# Patient Record
Sex: Male | Born: 1975 | Race: White | Hispanic: No | Marital: Single | Smoking: Current every day smoker
Health system: Southern US, Community
[De-identification: ages and names within clinical notes are randomized; demographics above are authoritative.]

## PROBLEM LIST (undated history)

## (undated) DIAGNOSIS — S0530XA Ocular laceration without prolapse or loss of intraocular tissue, unspecified eye, initial encounter: Secondary | ICD-10-CM

## (undated) HISTORY — PX: EYE SURGERY: SHX253

## (undated) HISTORY — DX: Ocular laceration without prolapse or loss of intraocular tissue, unspecified eye, initial encounter: S05.30XA

## (undated) HISTORY — PX: KNEE SURGERY: SHX244

---

## 2010-11-27 ENCOUNTER — Emergency Department
Admission: EM | Admit: 2010-11-27 | Disposition: A | Discharge status: Home / Self Care | Payer: Self-pay | Source: Ambulatory Visit

## 2012-04-03 ENCOUNTER — Emergency Department
Admission: EM | Admit: 2012-04-03 | Disposition: A | Discharge status: Home / Self Care | Payer: Self-pay | Source: Ambulatory Visit

## 2012-04-03 LAB — BASIC METABOLIC PANEL
Anion Gap: 15.4 mMol/L (ref 7.0–18.0)
BUN / Creatinine Ratio: 18.8 Ratio (ref 10.0–30.0)
BUN: 13 mg/dL (ref 7–22)
CO2: 15 mMol/L — CL (ref 20.0–30.0)
Calcium: 6.9 mg/dL — ABNORMAL LOW (ref 8.5–10.5)
Chloride: 117 mMol/L — ABNORMAL HIGH (ref 98–110)
Creatinine: 0.69 mg/dL (ref 0.60–1.30)
EGFR: >60 mL/min/{1.73_m2}
Glucose: 85 mg/dL (ref 70–99)
Osmolality Calc: 286 mOsm/kg (ref 275–300)
Potassium: 3.4 mMol/L — ABNORMAL LOW (ref 3.5–5.3)
Sodium: 144 mMol/L (ref 136–147)

## 2012-04-03 LAB — CBC AND DIFFERENTIAL
Basophils %: 0.1 % (ref 0.0–3.0)
Basophils Absolute: 0 10*3/uL (ref 0.0–0.3)
Eosinophils %: 1.3 % (ref 0.0–7.0)
Eosinophils Absolute: 0.2 10*3/uL (ref 0.0–0.8)
Hematocrit: 51.5 % (ref 39.0–52.5)
Hemoglobin: 17.3 gm/dL (ref 13.0–17.5)
Lymphocytes Absolute: 0.6 10*3/uL (ref 0.6–5.1)
Lymphocytes: 3.6 % — ABNORMAL LOW (ref 15.0–46.0)
MCH: 30 pg (ref 28–35)
MCHC: 34 gm/dL (ref 33–37)
MCV: 90 fL (ref 80–100)
MPV: 9.1 fL (ref 8.0–12.0)
Monocytes Absolute: 0.6 10*3/uL (ref 0.1–1.7)
Monocytes: 3.7 % (ref 3.0–15.0)
Neutrophils %: 91.3 % — ABNORMAL HIGH (ref 42.0–78.0)
Neutrophils Absolute: 14 10*3/uL — ABNORMAL HIGH (ref 1.7–8.6)
PLT CT: 231 10*3/uL (ref 130–440)
RBC: 5.73 10*6/uL — ABNORMAL HIGH (ref 4.00–5.70)
RDW: 13.2 % (ref 12.0–15.0)
WBC: 15.3 10*3/uL — ABNORMAL HIGH (ref 4.0–11.0)

## 2013-01-06 ENCOUNTER — Ambulatory Visit (INDEPENDENT_AMBULATORY_CARE_PROVIDER_SITE_OTHER): Payer: 59 | Admitting: Physician Assistant

## 2013-01-06 ENCOUNTER — Emergency Department
Admission: EM | Admit: 2013-01-06 | Discharge: 2013-01-06 | Disposition: A | Discharge status: Home / Self Care | Payer: 59 | Attending: Emergency Medicine | Admitting: Emergency Medicine

## 2013-01-06 ENCOUNTER — Emergency Department: Payer: 59

## 2013-01-06 ENCOUNTER — Encounter (INDEPENDENT_AMBULATORY_CARE_PROVIDER_SITE_OTHER): Payer: Self-pay

## 2013-01-06 VITALS — BP 139/78 | HR 95 | Temp 97.5°F | Resp 18 | Ht 73.0 in | Wt 224.6 lb

## 2013-01-06 DIAGNOSIS — H6693 Otitis media, unspecified, bilateral: Secondary | ICD-10-CM

## 2013-01-06 DIAGNOSIS — IMO0001 Reserved for inherently not codable concepts without codable children: Secondary | ICD-10-CM | POA: Insufficient documentation

## 2013-01-06 DIAGNOSIS — J329 Chronic sinusitis, unspecified: Secondary | ICD-10-CM | POA: Insufficient documentation

## 2013-01-06 DIAGNOSIS — H6690 Otitis media, unspecified, unspecified ear: Secondary | ICD-10-CM | POA: Insufficient documentation

## 2013-01-06 DIAGNOSIS — R51 Headache: Secondary | ICD-10-CM | POA: Insufficient documentation

## 2013-01-06 DIAGNOSIS — J069 Acute upper respiratory infection, unspecified: Secondary | ICD-10-CM

## 2013-01-06 DIAGNOSIS — R519 Headache, unspecified: Secondary | ICD-10-CM

## 2013-01-06 LAB — POCT INFLUENZA A/B
POCT Rapid Influenza A AG: NEGATIVE
POCT Rapid Influenza B AG: NEGATIVE

## 2013-01-06 MED ORDER — AMOXICILLIN-POT CLAVULANATE 875-125 MG PO TABS
1.00 | ORAL_TABLET | Freq: Two times a day (BID) | ORAL | Status: AC
Start: 2013-01-06 — End: 2013-01-16

## 2013-01-06 MED ORDER — KETOROLAC TROMETHAMINE 30 MG/ML IJ SOLN
30.00 mg | Freq: Once | INTRAMUSCULAR | Status: AC
Start: 2013-01-06 — End: 2013-01-06
  Administered 2013-01-06: 30 mg via INTRAVENOUS

## 2013-01-06 MED ORDER — DIPHENHYDRAMINE HCL 50 MG/ML IJ SOLN
INTRAMUSCULAR | Status: AC
Start: 2013-01-06 — End: ?
  Filled 2013-01-06: qty 1

## 2013-01-06 MED ORDER — METOCLOPRAMIDE HCL 5 MG/ML IJ SOLN
INTRAMUSCULAR | Status: AC
Start: 2013-01-06 — End: ?
  Filled 2013-01-06: qty 2

## 2013-01-06 MED ORDER — KETOROLAC TROMETHAMINE 30 MG/ML IJ SOLN
INTRAMUSCULAR | Status: AC
Start: 2013-01-06 — End: ?
  Filled 2013-01-06: qty 1

## 2013-01-06 MED ORDER — DIPHENHYDRAMINE HCL 50 MG/ML IJ SOLN
25.0000 mg | Freq: Once | INTRAMUSCULAR | Status: AC
Start: 2013-01-06 — End: 2013-01-06
  Administered 2013-01-06: 25 mg via INTRAVENOUS

## 2013-01-06 MED ORDER — PSEUDOEPHEDRINE HCL 60 MG PO TABS
60.0000 mg | ORAL_TABLET | ORAL | Status: DC | PRN
Start: 2013-01-06 — End: 2015-10-27

## 2013-01-06 MED ORDER — PREDNISONE 20 MG PO TABS
ORAL_TABLET | ORAL | Status: AC
Start: 2013-01-06 — End: ?
  Filled 2013-01-06: qty 3

## 2013-01-06 MED ORDER — FLUTICASONE PROPIONATE 50 MCG/ACT NA SUSP
1.0000 | Freq: Every day | NASAL | Status: DC
Start: 2013-01-06 — End: 2015-10-27

## 2013-01-06 MED ORDER — METOCLOPRAMIDE HCL 5 MG/ML IJ SOLN
10.0000 mg | Freq: Once | INTRAMUSCULAR | Status: AC
Start: 2013-01-06 — End: 2013-01-06
  Administered 2013-01-06: 10 mg via INTRAVENOUS

## 2013-01-06 MED ORDER — PREDNISONE 20 MG PO TABS
60.0000 mg | ORAL_TABLET | Freq: Once | ORAL | Status: AC
Start: 2013-01-06 — End: 2013-01-06
  Administered 2013-01-06: 60 mg via ORAL

## 2013-01-06 NOTE — Progress Notes (Signed)
Subjective:    Patient ID: Brandon Murillo is a 38 y.o. male.    HPI Comments: Patient tells me he has been having constant headache 10/10 the past 3 days.  No fevers but some chills and body aches, sore throat and cough with some sputum production yellowish in color.  Denies having headache similar in past and admits to eye pain.  No vomiting or diarrhea, no numbness or tingling in extremities, no neck stiffness.    Headache   This is a new problem. The current episode started in the past 7 days. The problem occurs constantly. The problem has been gradually worsening. The pain is located in the bilateral region. The pain does not radiate. The pain quality is not similar to prior headaches. The quality of the pain is described as aching. The pain is at a severity of 10/10. Associated symptoms include coughing, sinus pressure and a sore throat. Pertinent negatives include no back pain, blurred vision, dizziness, ear pain, fever, hearing loss, numbness, scalp tenderness, seizures or weakness.   URI   Associated symptoms include congestion, coughing, headaches and a sore throat. Pertinent negatives include no ear pain or wheezing.       The following portions of the patient's history were reviewed and updated as appropriate: allergies, current medications, past family history, past medical history, past social history, past surgical history and problem list.    Review of Systems   Constitutional: Negative.  Negative for fever.   HENT: Positive for congestion, postnasal drip, sinus pressure and sore throat. Negative for ear discharge, ear pain, facial swelling and hearing loss.    Eyes: Negative.  Negative for blurred vision.   Respiratory: Positive for cough. Negative for apnea, choking, chest tightness, shortness of breath, wheezing and stridor.    Cardiovascular: Negative.    Gastrointestinal: Negative.    Musculoskeletal: Negative.  Negative for back pain.   Neurological: Positive for headaches. Negative for  dizziness, tremors, seizures, syncope, facial asymmetry, speech difficulty, weakness, light-headedness and numbness.         Objective:    BP 139/78  Pulse 95  Temp 97.5 F (36.4 C) (Oral)  Resp 18  Ht 1.854 m (6\' 1" )  Wt 101.878 kg (224 lb 9.6 oz)  BMI 29.64 kg/m2    Physical Exam   Nursing note and vitals reviewed.  Constitutional: He is oriented to person, place, and time. He appears well-developed and well-nourished.   HENT:   Right Ear: Tympanic membrane mobility is abnormal. A middle ear effusion is present.   Left Ear: Tympanic membrane mobility is abnormal. A middle ear effusion is present.   Ears:    Nose: Mucosal edema present.   Mouth/Throat: Uvula is midline and mucous membranes are normal. Posterior oropharyngeal erythema present. No oropharyngeal exudate, posterior oropharyngeal edema or tonsillar abscesses.   Eyes: Conjunctivae normal and EOM are normal. Pupils are equal, round, and reactive to light. Right eye exhibits no discharge. Left eye exhibits no discharge.   Neck: Neck supple.   Cardiovascular: Normal rate, regular rhythm and normal heart sounds.    Pulmonary/Chest: Effort normal and breath sounds normal.   Lymphadenopathy:     He has no cervical adenopathy.   Neurological: He is alert and oriented to person, place, and time. He has normal strength. He displays normal reflexes. No cranial nerve deficit or sensory deficit. Coordination normal.   Reflex Scores:       Patellar reflexes are 2+ on the right side and 2+ on  the left side.  Psychiatric: He has a normal mood and affect. His behavior is normal.         Assessment and Plan:       Lab Results from today's visit:  Recent Results (from the past 4 hour(s))   POCT INFLUENZA A/B    Collection Time    01/06/13 10:10 AM       Component Value Range    POCT QC Pass      POCT Rapid Influenza A AG Negative  Negative    POCT Rapid Influenza B AG Negative  Negative       Radiology Results from today's visit:  No results found.    Assessment/Plan  for today's visit:  1. Upper respiratory infection  POCT Influenza A/B       Nathen May [place], [place]  [facility]  01/06/2013 10:21 AM        Patient sent to ER for worst headache of his life, rule out headache related to inner ear infections/ staph infection.              Leanord Asal, [place]  [facility]  01/06/2013  10:21 AM

## 2013-01-06 NOTE — Discharge Instructions (Signed)
Causes of Sinusitis           Mucus helps keep your sinuses clean. But mucus may build up in the sinuses due to colds, allergies, or obstructions. These things interfere with the natural drainage of mucus. This may lead to sinusitis (sinus inflammation and infection). Acute sinusitis comes on suddenly. It often happens right after an upper respiratory infection, such as a cold. Chronic sinusitis is ongoing swelling of the sinus lining. This is often the result of allergies or chronic infections.  Colds and Other Infections  A cold or flu may cause your sinus and nasal linings to swell. Sinus openings can become blocked. This causes mucus to back up. This backed-up mucus becomes a perfect place for bacteria to grow. Thick, yellow, or discolored mucus is one sign of infection.  Allergic Reactions  You may be sensitive to certain substances. This causes the release of histamine in the body. Histamine makes your sinus and nasal linings swell. Long-term swelling clogs your sinuses. It prevents the cilia (tiny hairs in the nasal lining) from sweeping away mucus. Allergy symptoms can be persistent. But they're less severe than with colds.  Obstructions   A polyp is a sac of swollen tissue. It can be the result of an allergy or infection. It may block the middle meatus (the opening where most of your sinuses drain). It may even grow large enough to block your nose.   A deviated septum is when the thin wall inside your nose is pushed to one side. It is often the result of injury. This can block your middle meatus.   [address], [place], [place] [address]. All rights reserved. This information is not intended as a substitute for professional medical care. Always follow your healthcare professional's instructions.      Middle Ear Infection (Adult)  You have an infection of the middle ear (the space behind the eardrum). It can occur as a result of the common cold. This is because congestion  can block the internal passage (eustachian tube) that drains fluid from the middle ear. When the middle ear fills with fluid, bacteria can grow there and cause an infection. Oral antibiotics are used to treat this illness, not ear drops. Symptoms usually start to improve within 1-2 days of treatment.    Home Care:   Doreatha Martin all of the antibiotic medicine prescribed, even though you may feel better after the first few days.   You may use acetaminophen (Tylenol) or ibuprofen (Motrin, Advil) to control pain, unless something else was prescribed. [NOTE: If you have chronic liver or kidney disease or have ever had a stomach ulcer or GI bleeding, talk with your doctor before using these medicines.] (Do not give aspirin to anyone under 76 years of age who is ill with a fever. It may cause severe liver damage.)  Follow Up  with your doctor or this facility in two weeks if all symptoms have not cleared, or if hearing does not return to normal within one month.  Get Prompt Medical Attention  if any of the following occur:   Ear pain gets worse or does not improve after three days of treatment   Unusual drowsiness or confusion   Neck pain, stiff neck or headache   Fluid or blood draining from the ear canal   Fever of 100.106F (38C) or higher after 3 days of antibiotics, or as directed by your healthcare provider   Convulsion (seizure)   [id number] [employer],  [address], [place] [address]. All rights reserved. This information is not intended as a substitute for professional medical care. Always follow your healthcare professional's instructions.

## 2013-01-06 NOTE — ED Notes (Signed)
Patient stated he has had headaches since Saturday, feeling run down and when patient coughs his chest hurt.  Patient went to Urgent Care today and was checked for the flu which was negative.  Staff at Urgent Care advised patient to come to the emergency room.

## 2013-01-06 NOTE — ED Provider Notes (Signed)
Physician/Midlevel provider first contact with patient: 01/06/13 1424         [facility] EMERGENCY DEPARTMENT HISTORY AND PHYSICAL EXAM      Patient Name: Brandon Murillo, Brandon Murillo  Encounter Date:  01/06/2013  ED Provider: Simone Curia PA-C  PCP: Christa See, MD  Patient DOB:  18-Apr-1975  MRN:  [id number]  Room:  12/ED12-A      History of Presenting Illness:   Chief complaint: Migraine    HPI/ROS is limited by: none  HPI/ROS given by: patient    Location: head  Duration: 4 days  Severity: moderate    [name] is a 38 y.o. male who presents with headache for the past 4 days, pt denies history of headaches in the past, pt notes he has felt achey all over as well.  Pt seen at urgent care earlier today and tested for influenza which was negative, no fevers, no chills, no n/v/d, no cough.    Past Medical History:   History reviewed. No pertinent past medical history.    Past Surgical History:     Past Surgical History   Procedure Date   . Eye surgery        Family History:   History reviewed. No pertinent family history.    Social History:     History     Social History   . Marital Status: Single     Spouse Name: N/A     Number of Children: N/A   . Years of Education: N/A     Social History Main Topics   . Smoking status: Former Smoker     Quit date: 06/07/2010   . Smokeless tobacco: Not on file   . Alcohol Use: Yes      Comment: socially   . Drug Use: No   . Sexually Active:      Other Topics Concern   . Not on file     Social History Narrative   . No narrative on file       Allergies:   No Known Allergies    Medications:     Previous Medications    No medications on file        Review of Systems:   General:  no fevers, no chills, no weight change    Skin:  no rashes, no itching, no sores    HEENT:  CONGESTION, no runny nose, no earache,  no sore throat     Neck:  no swollen glands, no neck pain, no neck stiffness    Respiratory:  no cough, no wheezing, no shortness of breath    Cardiovascular:  no chest pain,  no chest discomfort, no palpitations    Gastrointestinal:  no abdominal pain, no nausea, no vomiting, no diarrhea    Urinary:  no urinary frequency, no dysuria, no hematuria    Musculoskeletal:  BODY ACHES, no joint pains, no muscle pains, no back pain    Neurologic: HEADACHE, no weakness, no numbness, no syncope    Physical Exam:   Blood pressure 147/78, pulse 93, temperature 97.9 F (36.6 C), resp. rate 18, height 1.854 m, weight 102.059 kg, SpO2 99.00%.     Constitutional:  Vitals signs reviewed. Well-appearing.  Well-nourished. No acute distress.    Head:  Atraumatic, normocephalic    Eyes:  Pupils equal, round, and reactive to light and accomodation.  Conjunctiva clear. No injection. No scleral icterus.    Ears: purulent fluid behind both TM's    Nose:  Mucous membranes  moist.  No discharge.     Throat:  Oropharynx clear and moist.  No erythema.  No exudates     Neck:  Supple, non-tender.  No cervical lymphadenopathy.    Respiratory:  Breath sounds normal.  No distress    Chest:  Non-tender    Cardiovascular:  Heart regular rate and rhythm.  No murmurs/gallops/rubs.  Pulses +2 bilaterally    Abdomen:  Soft. Non-tender.  Normal bowel sounds. No distension. No bruits.    Extremities:  Full range of motion.  No edema.  No cyanosis.  No deformity.    Skin:  Warm.  Dry.  No pallor.  No rashes.  No lesions.  No bruises    Neurological:  Alert. Oriented to person,place, time.  GCS 15.      Orders Placed:     Orders Placed This Encounter   Procedures   . CT Head WO Contrast   . Saline lock IV       Diagnostic Results:     The results of the diagnostic studies below have been reviewed by myself:    Labs  Results     ** No Results found for the last 24 hours. **          Radiologic Studies  Radiology Results (24 Hour)     Procedure Component Value Units Date/Time    CT Head WO Contrast [[id number]] Collected:01/06/13 1446    Order Status:Completed  Updated:01/06/13 1451    Narrative:    Clinical  History:  headache    Examination:  CT head without intravenous contrast.    Comparison:  None available.    Findings:  Mild right ethmoid air cell opacity. No acute territorial infarct or mass effect. No hydrocephalus. No intracranial  hemorrhage noted. The visualized mastoid air cells are clear. Consider MRI scanning, if symptoms persist.      Impression:    Impression: Mild sinus disease, but no evidence for intracranial hemorrhage or mass effect.      ReadingStation:[id number]          EKG: none    Procedure:   none      Assessment/Plan:   Pt reports headache completely resolved, will treat otitis media and sinus disease with augmentin flonase and pseudoephedrine, single does of prednisone given in ED as well                Diagnosis / Disposition     Clinical Impression  1. Headache    2. Sinusitis    3. Otitis media, bilateral        Follow Up  Referral physician services  [phone]        Lake Bells, MD  [address]  [address] [place] [address]  [phone]      please follow up with the ENT next week       Disposition  ED Disposition     Discharge Fernande Boyden discharge to home/self care.    Condition at disposition: Stable            Prescriptions  New Prescriptions    AMOXICILLIN-CLAVULANATE (AUGMENTIN) 875-125 MG PER TABLET    Take 1 tablet by mouth 2 (two) times daily.    FLUTICASONE (FLONASE) 50 MCG/ACT NASAL SPRAY    1 spray by Nasal route daily.    PSEUDOEPHEDRINE (SUDAFED) 60 MG TABLET    Take 1 tablet (60 mg total) by mouth every 4 (four) hours as needed for Congestion.  Simone Curia [place], [place]  01/06/13 1743

## 2015-10-27 ENCOUNTER — Emergency Department: Payer: Self-pay

## 2015-10-27 ENCOUNTER — Emergency Department
Admission: EM | Admit: 2015-10-27 | Discharge: 2015-10-27 | Disposition: A | Discharge status: Home / Self Care | Payer: Self-pay | Attending: Emergency Medicine | Admitting: Emergency Medicine

## 2015-10-27 DIAGNOSIS — F17210 Nicotine dependence, cigarettes, uncomplicated: Secondary | ICD-10-CM | POA: Insufficient documentation

## 2015-10-27 DIAGNOSIS — S0501XA Injury of conjunctiva and corneal abrasion without foreign body, right eye, initial encounter: Secondary | ICD-10-CM | POA: Insufficient documentation

## 2015-10-27 DIAGNOSIS — H1131 Conjunctival hemorrhage, right eye: Secondary | ICD-10-CM | POA: Insufficient documentation

## 2015-10-27 DIAGNOSIS — H209 Unspecified iridocyclitis: Secondary | ICD-10-CM | POA: Insufficient documentation

## 2015-10-27 MED ORDER — ERYTHROMYCIN 5 MG/GM OP OINT
TOPICAL_OINTMENT | Freq: Once | OPHTHALMIC | Status: AC
Start: 2015-10-27 — End: 2015-10-27
  Filled 2015-10-27: qty 1

## 2015-10-27 MED ORDER — OXYCODONE-ACETAMINOPHEN 5-325 MG PO TABS
1.0000 | ORAL_TABLET | Freq: Once | ORAL | Status: AC
Start: 2015-10-27 — End: 2015-10-27
  Administered 2015-10-27: 1 via ORAL
  Filled 2015-10-27: qty 1

## 2015-10-27 MED ORDER — OXYCODONE-ACETAMINOPHEN 5-325 MG PO TABS
1.0000 | ORAL_TABLET | Freq: Four times a day (QID) | ORAL | 0 refills | Status: AC | PRN
Start: 2015-10-27 — End: ?

## 2015-10-27 MED ORDER — ERYTHROMYCIN 5 MG/GM OP OINT
TOPICAL_OINTMENT | Freq: Four times a day (QID) | OPHTHALMIC | 0 refills | Status: AC
Start: 2015-10-27 — End: 2015-11-01

## 2015-10-27 NOTE — ED Provider Notes (Addendum)
Triage note: Direct bedding on, patient taken straight to ER room. Orders placed based on chief complaint in order to expedite patient care. I am not the primary provider for this patient.     5:08 PM   Paged requested for Ophthalmologist, Dr. Lebron Quam. [phone]     Mignon Pine, MD  10/27/15 1708       Mignon Pine, MD  10/27/15 1711

## 2015-10-27 NOTE — Discharge Instructions (Signed)
Corneal Abrasion    You have been diagnosed with a corneal abrasion. This is a scratch on the eye.    The cornea is the clear part on the surface of the eye. It is in the center of your eye. It is right over the colored part (the iris).    The cornea has many nerve endings. Even a small scratch can cause a lot of pain.    The cornea is one of the fastest healing areas of the body. Corneal abrasions often heal within a day.    The cornea can get infected after a minor injury. Therefore, antibiotic eye drops or ointments are usually prescribed. Use the antibiotic as directed.    Most abrasions heal quickly without complications. Therefore, follow-up may not be needed. The doctor will decide if follow-up is needed.    Follow up with your doctor or referral ophthalmologist (eye doctor) as directed.    Corneal abrasions used to be treated with eye patches. Often, an eye patch is not necessary. It can cause vision and depth perception (telling how far away things are) problems. An eye patch can also make it hard to tell if your condition is getting worse. Your doctor will decide if a patch is needed.    DO NOT USE your contact lenses in the affected eye.    You should always have eye glasses to use as a backup whenever you have eye problems. This is because you should never wear contact lenses when your eye is irritated.    YOU SHOULD SEEK MEDICAL ATTENTION IMMEDIATELY, EITHER HERE OR AT THE NEAREST EMERGENCY DEPARTMENT, IF ANY OF THE FOLLOWING OCCURS:   Vision gets worse or no improvement after 24 hours.   More eye pain and / or high light sensitivity.   Drainage from the eye.    If you can t follow up with your doctor, or if at any time you feel you need to be rechecked or seen again, come back here or go to the nearest emergency department.              Facial Contusion    You have been diagnosed with a facial contusion.    Contusion is the medical term for a bruise. A facial contusion can be caused by  a fall or by being struck in the face.    The skin, muscles and other soft tissues of the face may become swollen and painful. You may have other injuries, like cuts or scrapes. The bones under your face might be bruised.    The doctor does not believe you have injured essential organs, like your eyes, brain or spine.    Apply ice to the face to help with pain and swelling. Place some ice cubes in a re-sealable plastic bag (like Ziploc). Add some water. Seal the bag. Put a thin washcloth between the bag and the skin. Apply the ice bag for at least 20 minutes. Do this at least 4 times per day. It's okay to apply ice longer or more often. NEVER APPLY ICE DIRECTLY TO THE SKIN. Always keep a washcloth between the ice pack and your body. Swelling may increase overnight when your head is down and gravity causes fluids to pool in your face. This should improve within a few hours after you are awake with your head up. Try sleeping with extra pillows to keep your head high.    Use acetaminophen (Tylenol) or ibuprofen (Advil or Motrin) to decrease pain and  inflammation. The physician will decide if you need a prescription medication.    If your nose bleeds, pinch it closed for 15 minutes. If that does not stop the bleeding, then return here or to the closest Emergency Department.    If you have a cut that requires stitches, then you will receive additional wound care instructions.    One concern after a facial injury is the possibility of other injuries to the head or neck. The doctor has determined that you do not have any other serious injuries and that it is OK for you to go home. If you develop symptoms of a head or neck injury, return immediately to the nearest Emergency Department.    YOU SHOULD SEEK MEDICAL ATTENTION IMMEDIATELY, EITHER HERE OR AT THE NEAREST EMERGENCY DEPARTMENT, IF ANY OF THE FOLLOWING OCCURS:   Your headaches are severe or become worse.   You vomit repeatedly.   You are lethargic or  difficult to awaken or you feel confused or seem intoxicated (drunk).    You have trouble with coordination or balance, feel dizzy, pass out, or have difficulty speaking or slurred speech.    Your vision changes or your pupils are unequal in size.      Iritis    You have been diagnosed with iritis.    Iritis is an inflammation of the colored part of the eye. This is most commonly caused by trauma and eye infections. Trauma can be a direct blow to the eye. It can also be a scratch on the cornea. In some cases the cause is not obvious. Some uncommon diseases that cause inflammation such as multiple sclerosis, lupus, rheumatoid arthritis and others can produce iritis as a complication.    Some symptoms of Iritis are blurry vision, watery eyes, pain in the eye (especially with bright light), and redness of the eye.    Iritis may be treated with drops for the eye and pain pills to help lower pain and swelling.    Follow-up is VERY IMPORTANT because if this problem isn t watched closely, it can cause vision problems that are permanent (for life).    The doctor who saw you today set up a follow-up appointment for you. Go to this appointment as scheduled!    YOU SHOULD SEEK MEDICAL ATTENTION IMMEDIATELY, EITHER HERE OR AT THE NEAREST EMERGENCY DEPARTMENT, IF ANY OF THE FOLLOWING OCCURS:   Pain gets worse despite medicine.   Worsening vision or vision loss in the affected eye.    Subconjunctival Hemorrhage    You have been seen for a "subconjunctival hemorrhage."    This happens when a small blood vessel in the white part of the eye breaks. It may be scary to have blood in your eye, but it is not dangerous.    The conjunctiva is a thin membrane that covers the white portion of your eye. It protects and lubricates it (keeps it wet). There are many small, delicate blood vessels in the conjunctiva that may break from a minor injury.    Since they are so delicate, the vessels can burst and bleed when the eye has a  minor injury. This injury can be caused by rubbing the eye, sneezing, coughing or straining.    This condition usually gets better in 2-3 weeks. Normally, people don t have any long-term problems. You may notice that the bleeding area changes colors from red to brown to yellow over a period of several weeks. It is just like a bruise  anywhere else on the body.    Follow up with your doctor as needed. It is not normally necessary to see an ophthalmologist (eye doctor).    YOU SHOULD SEEK MEDICAL ATTENTION IMMEDIATELY, EITHER HERE OR AT THE NEAREST EMERGENCY DEPARTMENT, IF ANY OF THE FOLLOWING OCCURS:   You have very bad eye pain.   Your vision changes.   The bleeding gets worse or you bruise easily on other parts of your body like the gums.    Dear Mr. Cheatum:    Thank you for choosing the [facility] Emergency Department, the premier emergency department in the [place] area.  I hope your visit today was EXCELLENT.  It was a pleasure taking care of you today.    Specific instructions for your visit today:    If you do not continue to improve or your condition worsens, please contact your doctor or return immediately to the Emergency Department.    Sincerely,  Olevia Bowens, MD  Attending Emergency Physician        and  Erick Blinks  Emergency Medicine Physician Assistant  [facility] Emergency Department    ONSITE PHARMACY  Our full service onsite pharmacy is located in the ER waiting room.  Open 7 days a week from 9 am to 11 pm.  We accept all major insurances and prices are competitive with major retailers.  Ask your provider to print your prescriptions down to the pharmacy to speed you on your way home.    OBTAINING A PRIMARY CARE APPOINTMENT    Primary care physicians (PCPs, also known as primary care doctors) are either internists or family medicine doctors. Both types of PCPs focus on health promotion, disease prevention, patient education and counseling, and treatment of acute  and chronic medical conditions.    Call for an appointment with a primary care doctor.  Ask to see who is taking new patients.     [facility]  telephone:  [phone]  [email or web address]    DOCTOR REFERRALS  Call [phone] (available 24 hours a day, 7 days a week) if you need any further referrals and we can help you find a primary care doctor or specialist.  Also, available online at:  [email or web address]    YOUR CONTACT INFORMATION  Before leaving please check with registration to make sure we have an up-to-date contact number.  You can call registration at [phone] to update your information.  For questions about your hospital bill, please call [phone].  For questions about your Emergency Dept Physician bill please call [phone].      FREE HEALTH SERVICES  If you need help with health or social services, please call 2-1-1 for a free referral to resources in your area.  2-1-1 is a free service connecting people with information on health insurance, free clinics, pregnancy, mental health, dental care, food assistance, housing, and substance abuse counseling.  Also, available online at:  http://www.[facility].org    MEDICAL RECORDS AND TESTS  Certain laboratory test results do not come back the same day, for example urine cultures.   We will contact you if other important findings are noted.  Radiology films are often reviewed again to ensure accuracy.  If there is any discrepancy, we will notify you.      Please call [phone] to pick up a complimentary CD of any radiology studies performed.  If you or your doctor would like to request a copy of your medical records, please call [phone].  ORTHOPEDIC INJURY   Please know that significant injuries can exist even when an initial x-ray is read as normal or negative.  This can occur because some fractures (broken bones) are not initially visible on x-rays.  For this reason, close outpatient follow-up with  your primary care doctor or bone specialist (orthopedist) is required.    MEDICATIONS AND FOLLOWUP  Please be aware that some prescription medications can cause drowsiness.  Use caution when driving or operating machinery.    The examination and treatment you have received in our Emergency Department is provided on an emergency basis, and is not intended to be a substitute for your primary care physician.  It is important that your doctor checks you again and that you report any new or remaining problems at that time.      24 HOUR PHARMACIES  The nearest 24 hour pharmacy is:    [facility]  [address]  [place], [place] [address]  [phone]      ASSISTANCE WITH INSURANCE    Affordable Care Act  [facility])  Call to start or finish an application, compare plans, enroll or ask a question.  [phone]  TTY: 2174727694  Web:  Healthcare.gov    Help Enrolling in [facility]  [employer] [place]  [phone] (TOLL-FREE)  470-327-5153 (TTY)  Web:  Http://www.coverva.org    Local Help Enrolling in the [facility]  [place] [facility]  [phone] (MAIN)  Email:  health-help@nvfs .org  Web:  BlackjackMyths.is  Address:  [address] [place], [place] [address]    SEDATING MEDICATIONS  Sedating medications include strong pain medications (e.g. narcotics), muscle relaxers, benzodiazepines (used for anxiety and as muscle relaxers), Benadryl/diphenhydramine and other antihistamines for allergic reactions/itching, and other medications.  If you are unsure if you have received a sedating medication, please ask your physician or nurse.  If you received a sedating medication: DO NOT drive a car. DO NOT operate machinery. DO NOT perform jobs where you need to be alert.  DO NOT drink alcoholic beverages while taking this medicine.     If you get dizzy, sit or lie down at the first signs. Be careful going up and down stairs.  Be extra careful to prevent falls.     Never give this medicine to  others.     Keep this medicine out of reach of children.     Do not take or save old medicines. Throw them away when outdated.     Keep all medicines in a cool, dry place. DO NOT keep them in your bathroom medicine cabinet or in a cabinet above the stove.    MEDICATION REFILLS  Please be aware that we cannot refill any prescriptions through the ER. If you need further treatment from what is provided at your ER visit, please follow up with your primary care doctor or your pain management specialist.    FREESTANDING EMERGENCY DEPARTMENTS OF [facility]  Did you know [facility] has two freestanding ERs located just a few miles away?  [facility] [place] and [facility] [place] have short wait times, easy free parking directly in front of the building and top patient satisfaction scores - and the same Board Certified Emergency Medicine doctors as [facility].              Iran Rowe  [id number]  [id number]  [phone]  10/27/2015    Discharge Instructions    As always, you  are the most important factor in your recovery.  Please follow these instructions carefully.  If you have problems that we have not discussed, CALL OR VISIT YOUR DOCTOR RIGHT AWAY.     If you can't reach your doctor, return to the emergency department.    I Fernande Boyden understand the written and discussed instructions.  My questions have been answered.  I acknowledge receipt of these instructions.     Patient or responsible person:         Patient's Signature               Physician or Nurse
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
## 2020-09-26 IMAGING — CR XR CHEST 2 VIEWS
1 series · 2 of 2 positions shown · non-contrast
Comparison: None available.

FINAL REPORT:
INDICATION: right chest/back pain

[Series 6664: PA · 0.19mm/px · 2 of 2 slices shown]
[im 1/2]
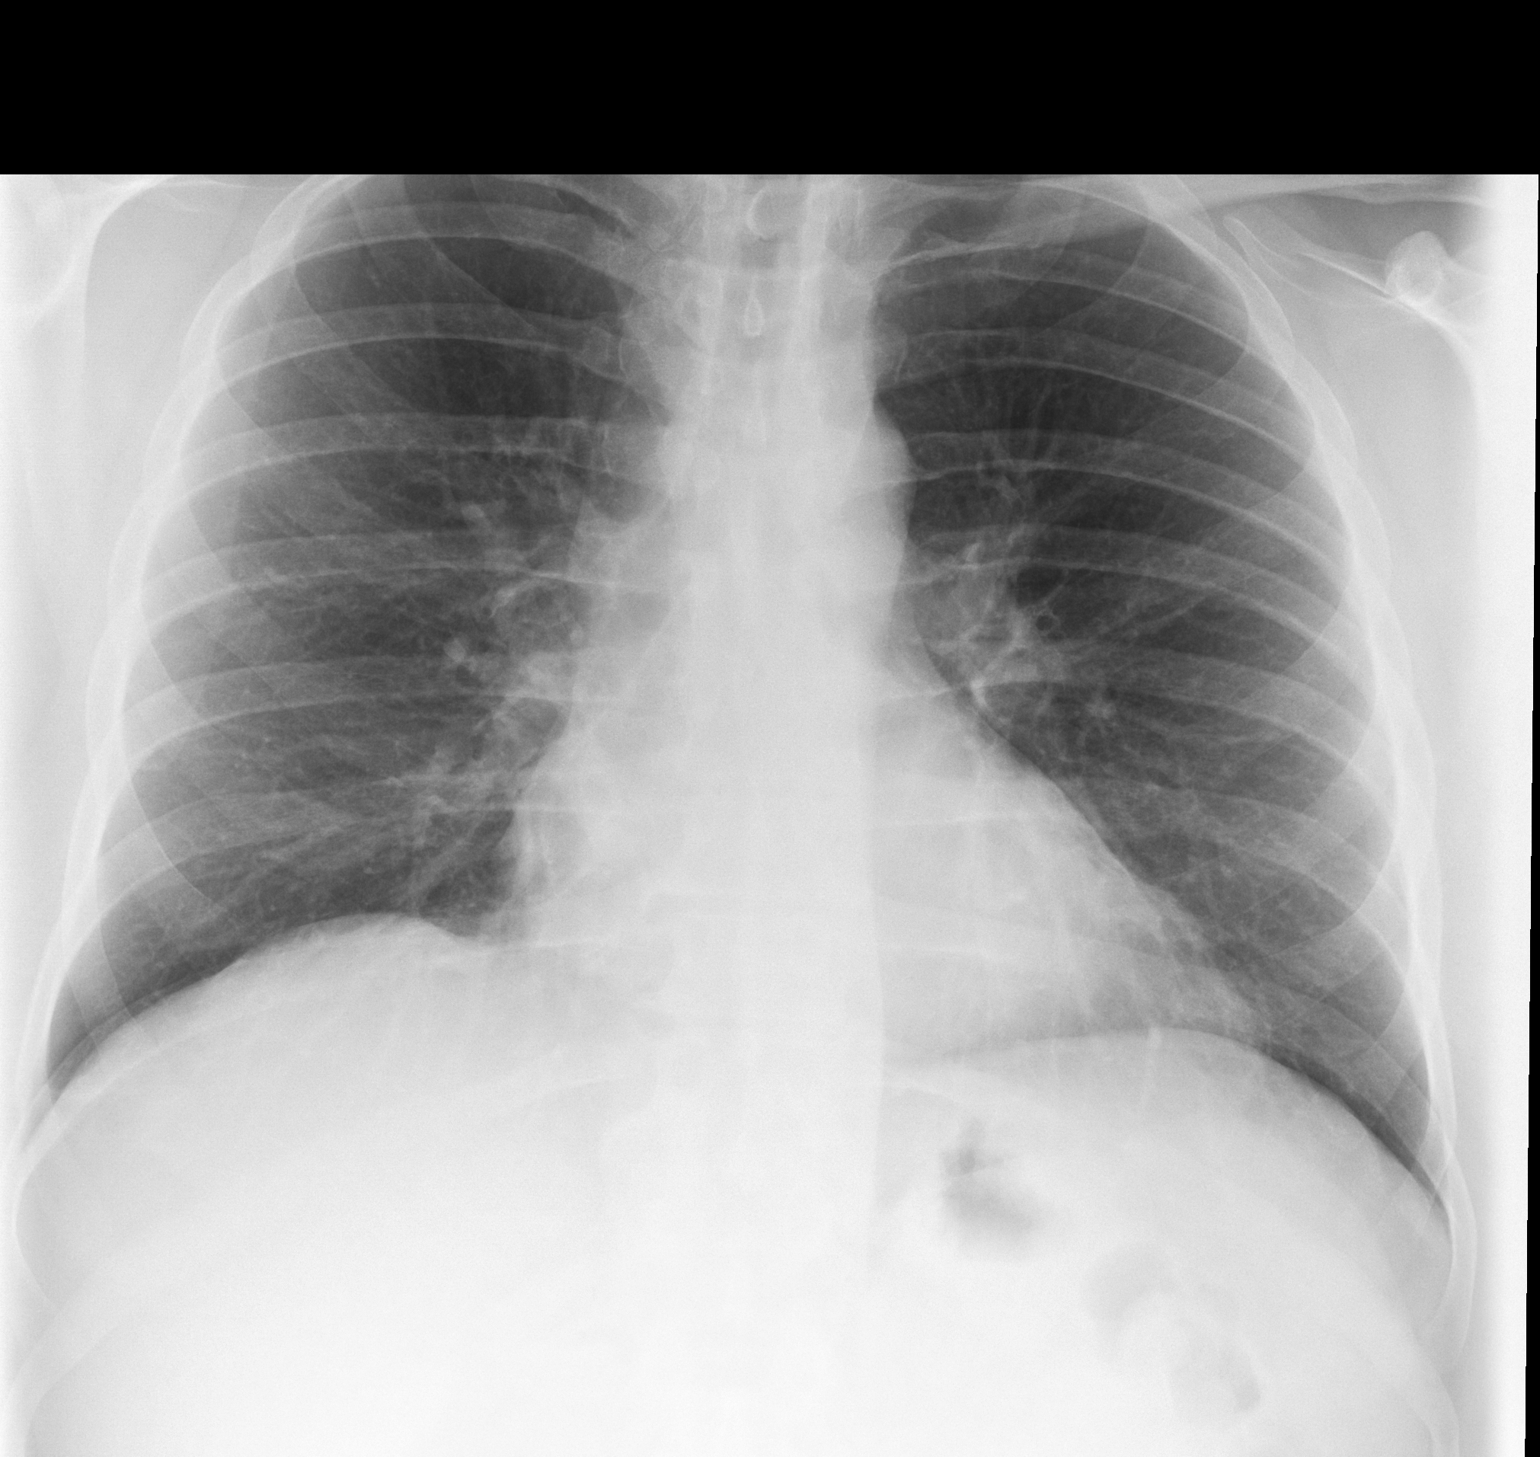
[im 2/2]
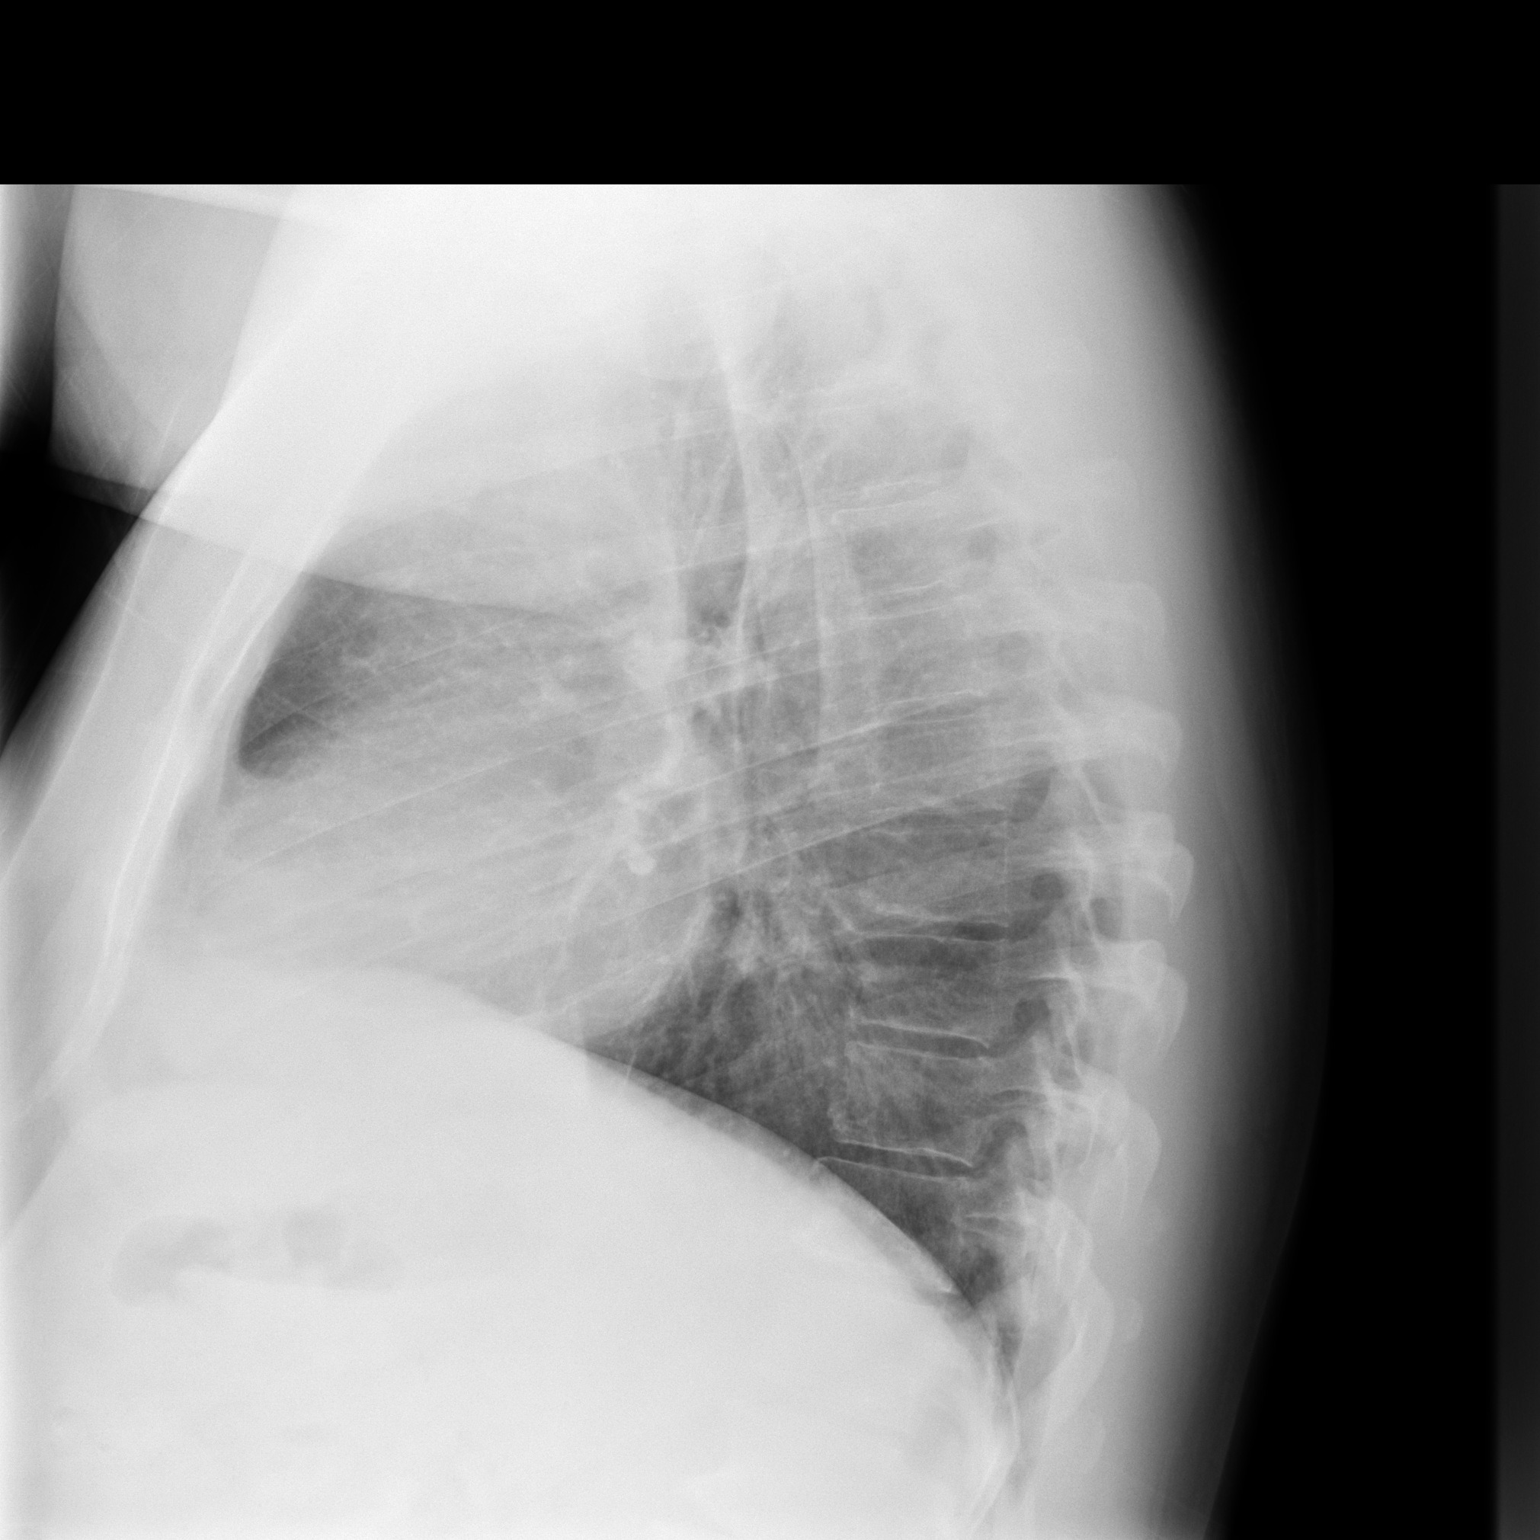

[2 of 2 positions shown; findings below may reference images not displayed]

FINDINGS: 2 views of the chest. The cardiomediastinal contours are within normal limits. No evidence of focal consolidation, pleural effusion, pulmonary edema, or pneumothorax.
IMPRESSION: 
IMPRESSION: No acute cardiopulmonary findings.

## 2021-01-03 IMAGING — MR MRI BRAIN WITH AND WITHOUT CONTRAST
8 of 17 series · 29 of 48 positions shown · non-contrast
Comparison: None.

FINAL REPORT:
MRI BRAIN WITH AND WITHOUT CONTRAST
INDICATION: Dizziness.
TECHNIQUE: Multiplanar multisequence MR imaging of the brain was performed with and without intravenous contrast.

[Series 5001: T1 · sagittal · 5.0mm · 0.40mm/px · 3 of 27 slices shown (1 of 2)]
[im 1/27]
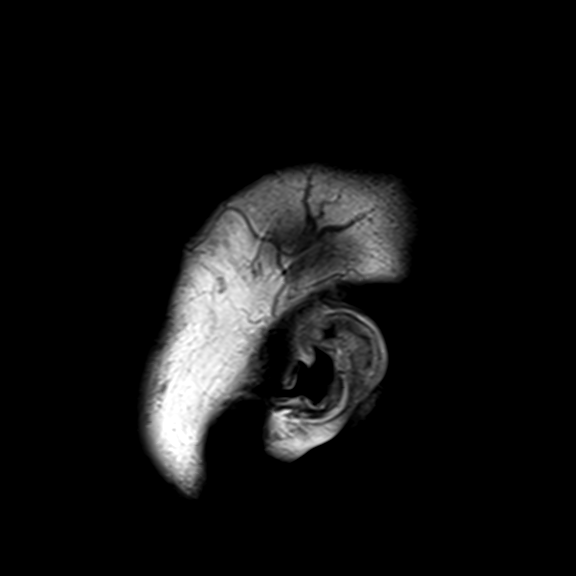
[im 14/27]
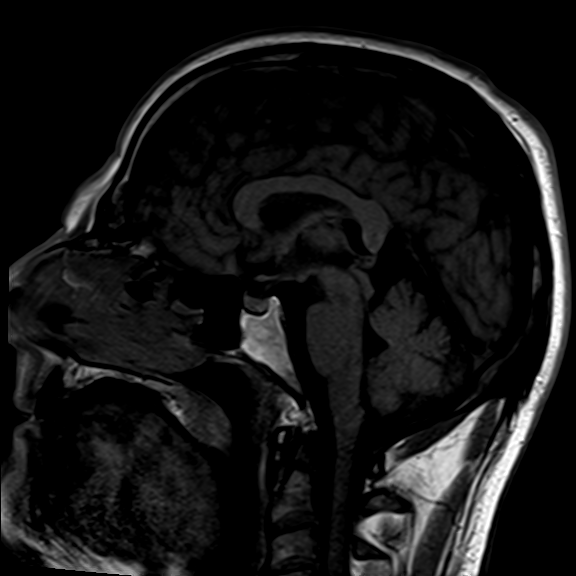
[im 27/27]
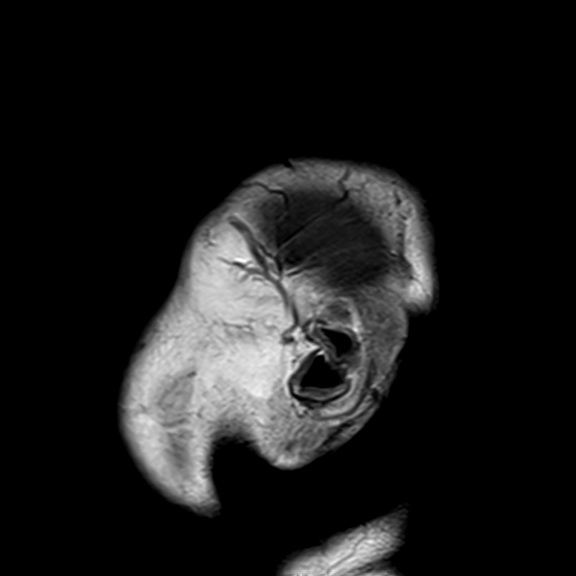

[Series 9001: T2 · axial · 5.0mm · 0.51mm/px · z∈[-59,+109]mm · 4 of 28 slices shown]
[im 1/28]
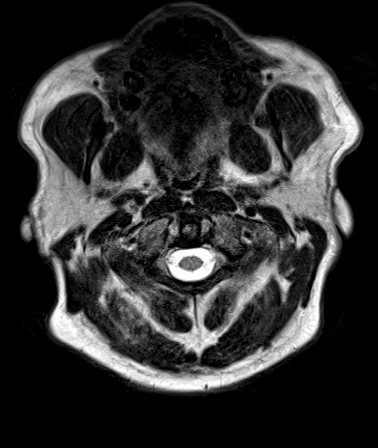
[im 10/28]
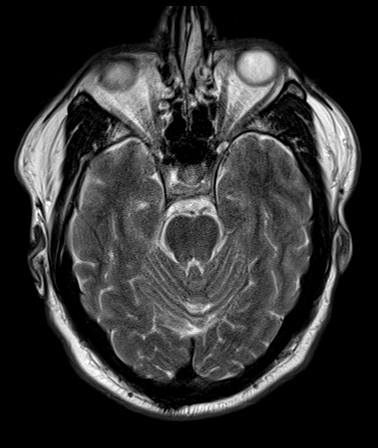
[im 19/28]
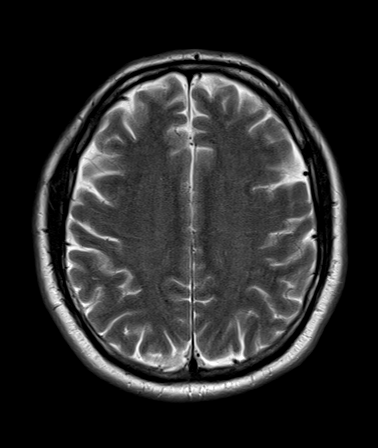
[im 28/28]
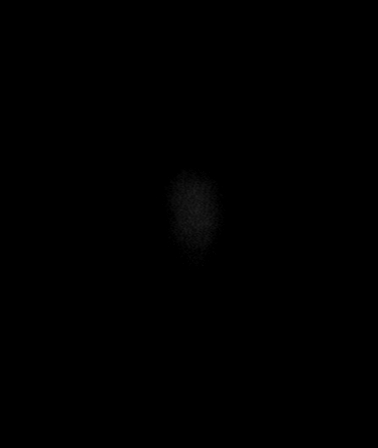

[T1 · axial · 5.0mm · 0.72mm/px · z∈[-59,-3]mm · 2 of 28 slices shown (2 of 2)]
[im 1/28]
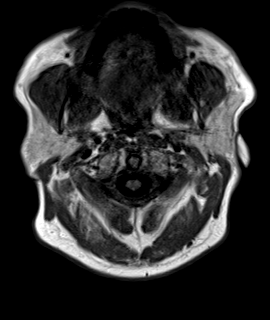
[im 10/28]
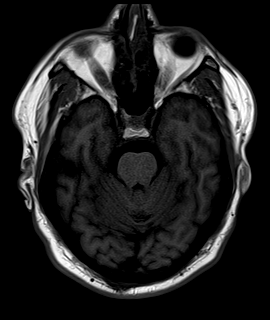

[FLAIR · axial · 5.0mm · 0.72mm/px · z∈[-59,+109]mm · 4 of 28 slices shown]
[im 1/28]
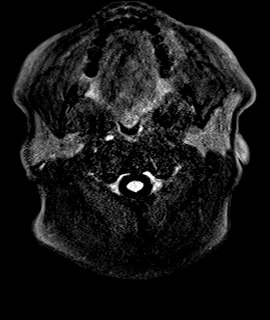
[im 10/28]
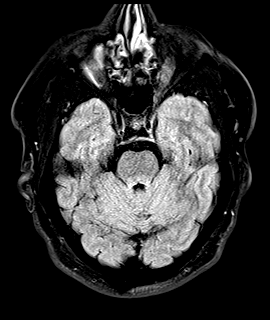
[im 19/28]
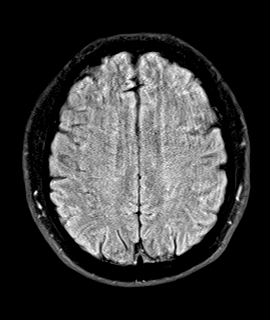
[im 28/28]
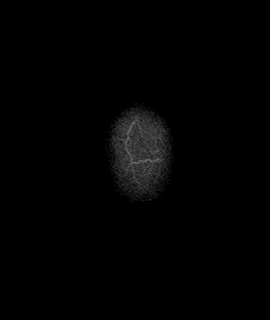

[GRE · axial · 5.0mm · 0.45mm/px · z∈[-59,+109]mm · 4 of 28 slices shown]
[im 1/28]
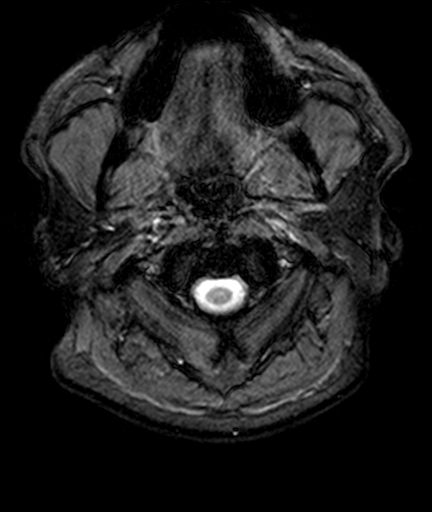
[im 10/28]
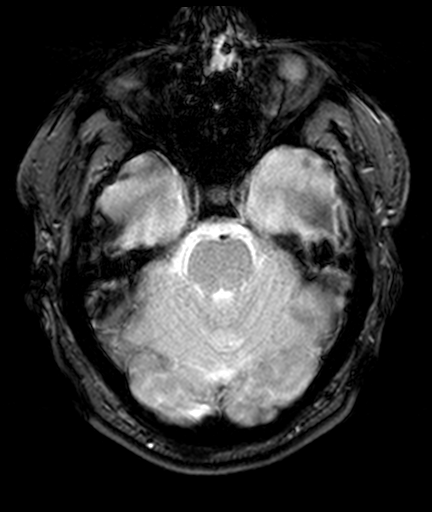
[im 19/28]
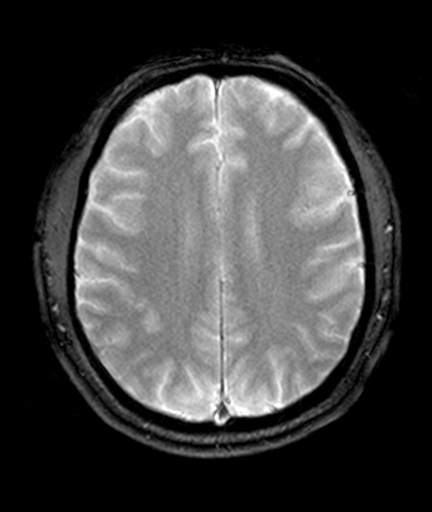
[im 28/28]
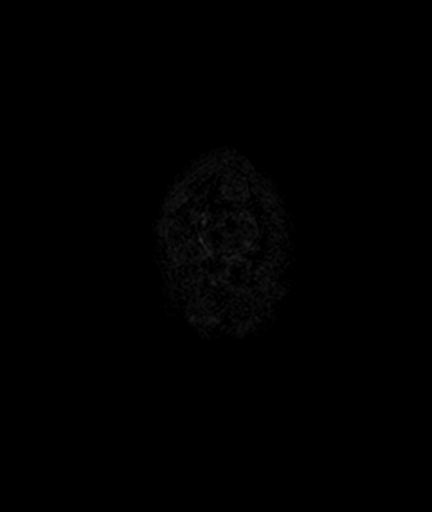

[T1 post-contrast · sagittal · 5.0mm · 0.40mm/px · 4 of 30 slices shown (1 of 3)]
[im 1/30]
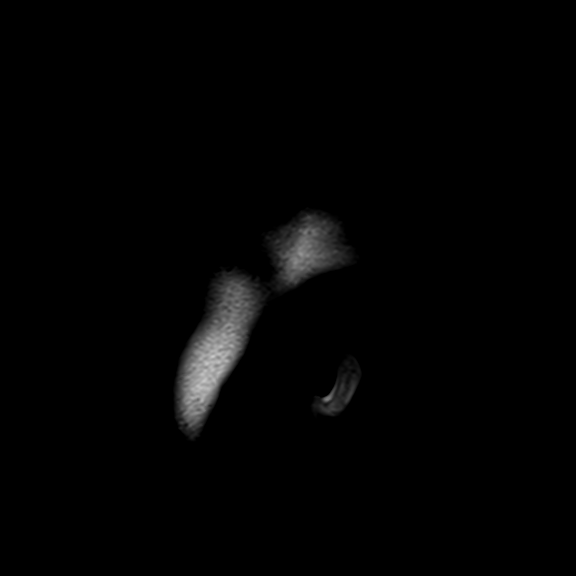
[im 10/30]
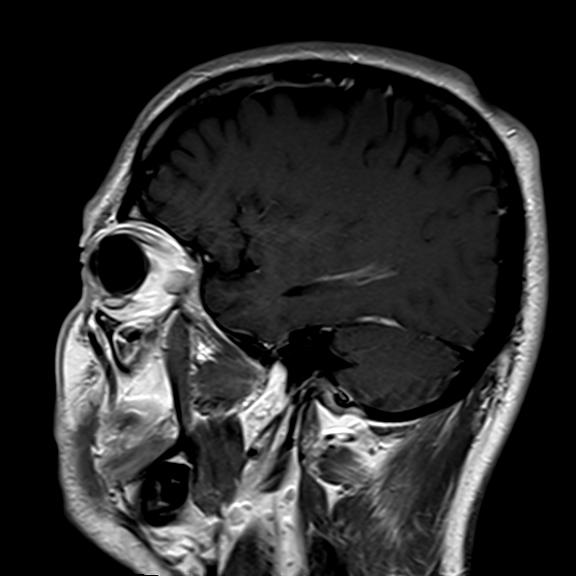
[im 20/30]
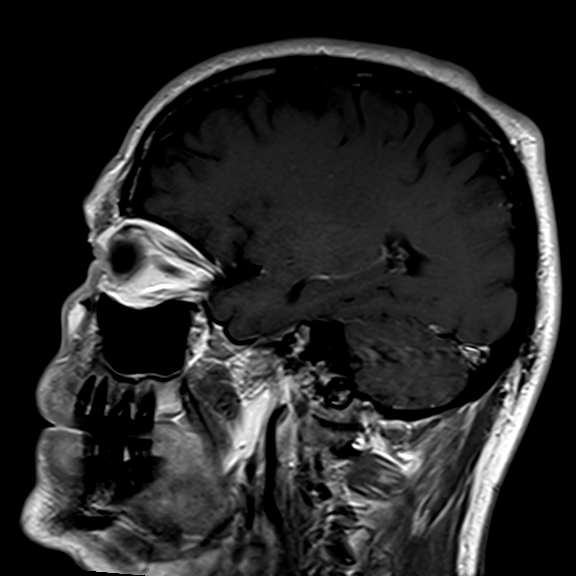
[im 30/30]
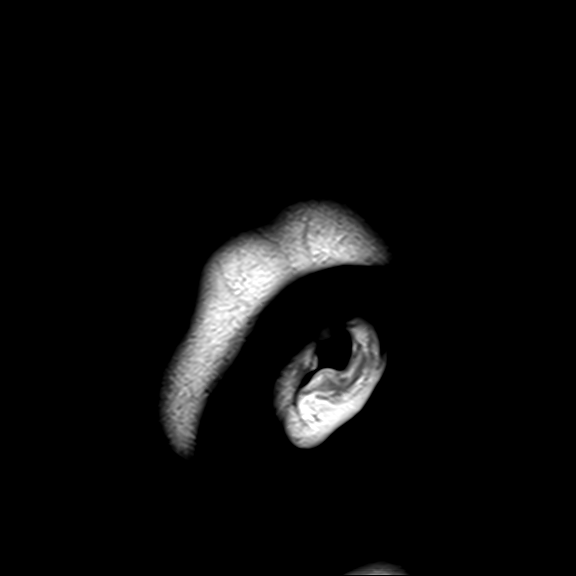

[T1 post-contrast · axial · 5.0mm · 0.72mm/px · z∈[-97,+67]mm · 4 of 28 slices shown (2 of 3)]
[im 1/28]
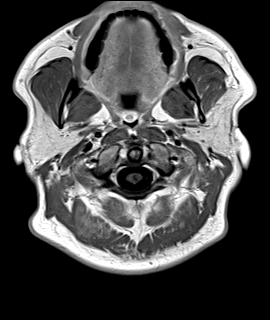
[im 10/28]
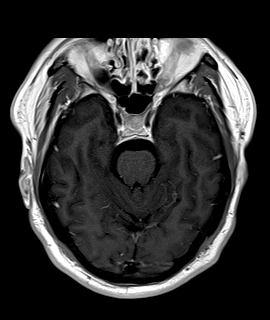
[im 19/28]
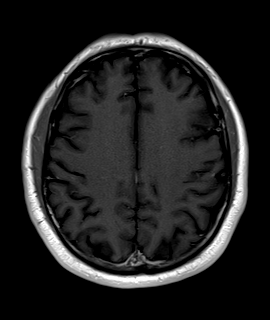
[im 28/28]
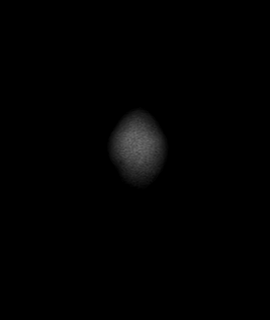

[T1 post-contrast · coronal · 5.0mm · 0.72mm/px · 4 of 30 slices shown (3 of 3)]
[im 1/30]
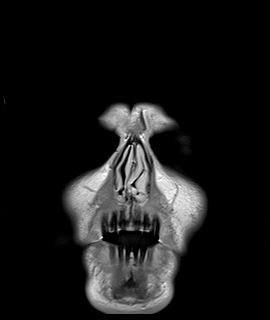
[im 10/30]
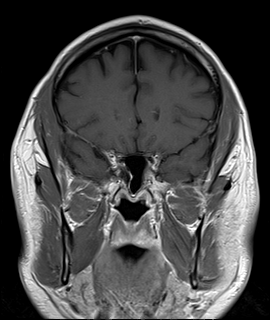
[im 20/30]
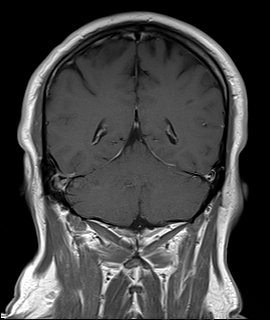
[im 30/30]
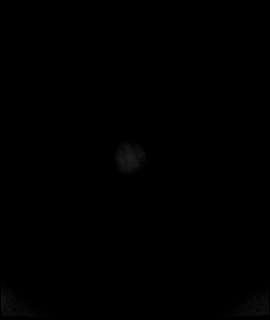

[29 of 48 positions shown; findings below may reference images not displayed]

FINDINGS: Brain volume is normal. No restricted diffusion to suggest acute infarct. No abnormal susceptibility artifact, mass effect, or hydrocephalus. No abnormal enhancement. No significant white matter disease.
The basal cisterns are patent. No cerebellar tonsillar herniation. The sellar and suprasellar structures are unremarkable. No extra-axial collections.
The orbits and globes are within normal limits. Small amount of fluid in the right mastoid. Mucosal thickening and secretions in the left maxillary sinus. Mucosal thickening in the ethmoid and frontal sinuses. No suspicious marrow lesions. The major vascular flow voids are preserved.
IMPRESSION: 
IMPRESSION: 1. No MR evidence of an acute intracranial process.
2. Paranasal sinus disease most pronounced in the left maxillary sinus.

## 2021-01-03 IMAGING — MR MRI THORACIC SPINE WITHOUT CONTRAST
5 of 10 series · 19 of 48 positions shown · non-contrast
Comparison: None available.

FINAL REPORT:
EXAM: MRI THORACIC SPINE WITHOUT CONTRAST
INDICATION: Ataxia, nontraumatic, thoracic pathology suspected
TECHNIQUE: Multiplanar multisequence MRI of the thoracic spine without IV contrast.

[Series 9001: T2 · sagittal · 4.0mm · 0.76mm/px · 3 of 15 slices shown]
[im 1/15]
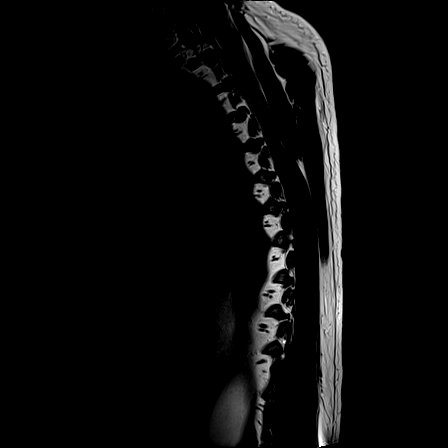
[im 8/15]
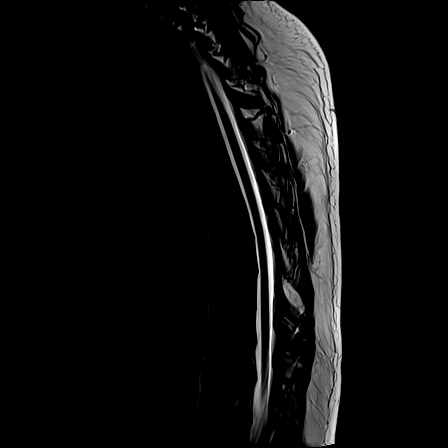
[im 15/15]
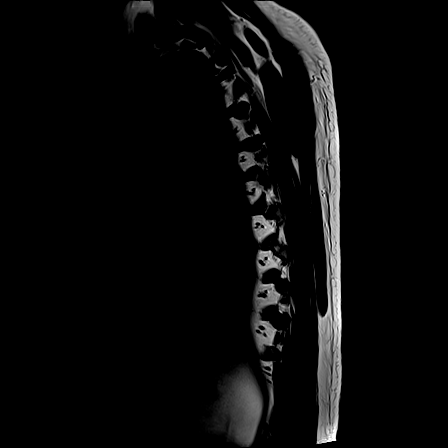

[T1 · sagittal · 4.0mm · 0.89mm/px · 3 of 15 slices shown]
[im 1/15]
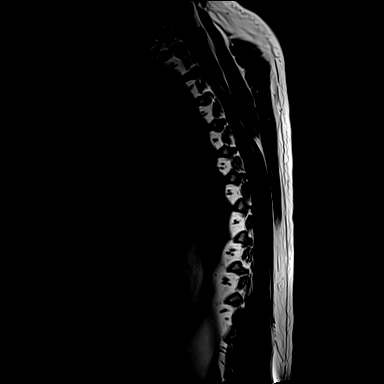
[im 8/15]
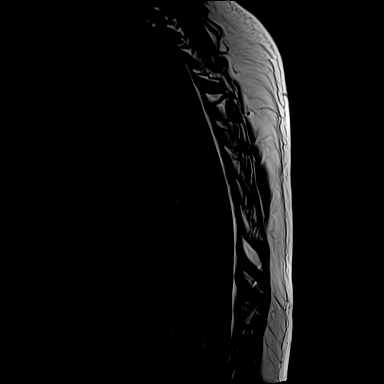
[im 15/15]
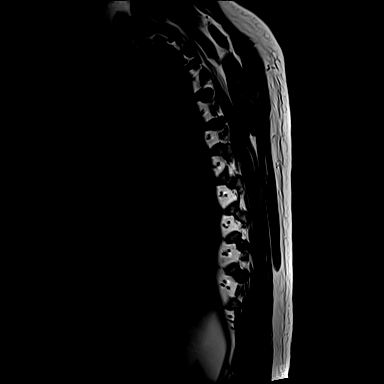

[STIR · sagittal · 4.0mm · 0.89mm/px · 3 of 15 slices shown]
[im 1/15]
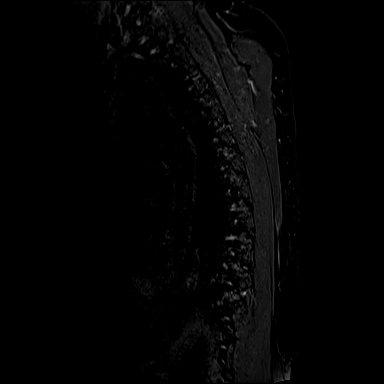
[im 8/15]
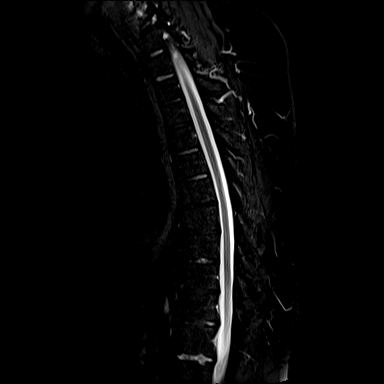
[im 15/15]
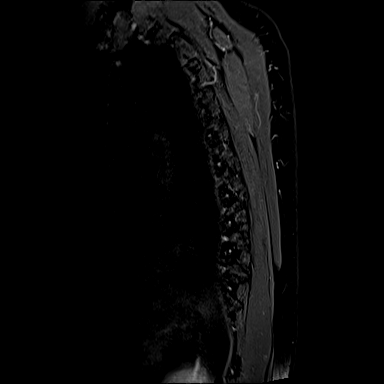

[GRE · axial · 7.0mm · 0.78mm/px · z∈[-115,+67]mm · 5 of 24 slices shown (1 of 2)]
[im 1/24]
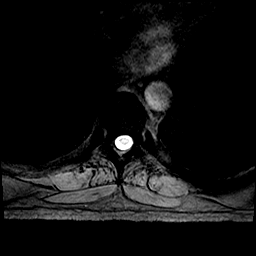
[im 6/24]
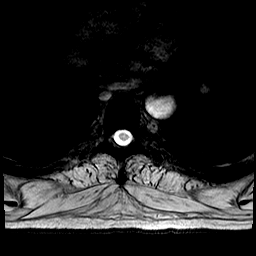
[im 12/24]
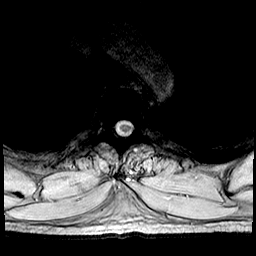
[im 18/24]
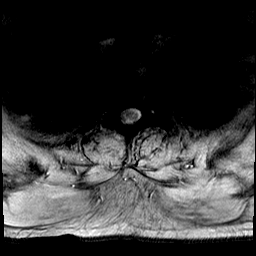
[im 24/24]
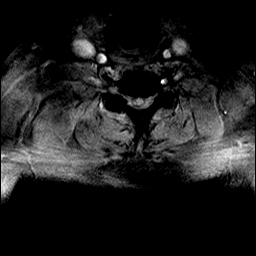

[GRE · axial · 7.0mm · 0.78mm/px · z∈[-245,-60]mm · 5 of 24 slices shown (2 of 2)]
[im 1/24]
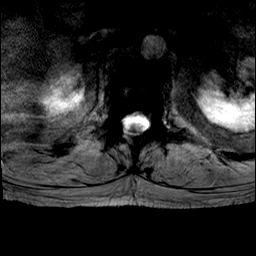
[im 6/24]
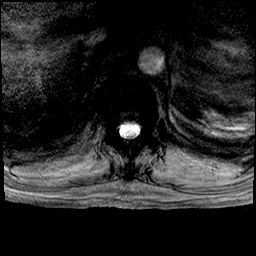
[im 12/24]
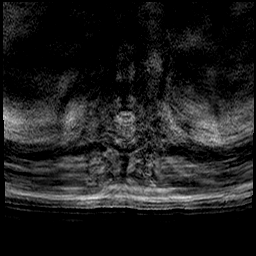
[im 18/24]
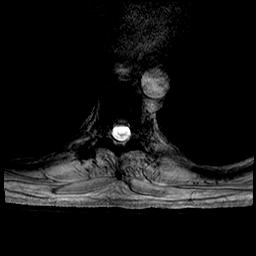
[im 24/24]
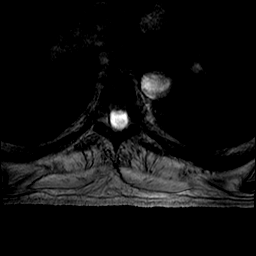

[19 of 48 positions shown; findings below may reference images not displayed]

FINDINGS: VERTEBRAL BODIES/ALIGNMENT: No acute fracture or traumatic malalignment.
MARROW: Unremarkable.
INTERVERTEBRAL DISCS: Multilevel disc height loss and desiccation.
PARAVERTEBRAL SOFT TISSUES: Unremarkable.
ADDITIONAL SOFT TISSUES: Unremarkable.
INTRATHECAL CONTENTS: No abnormal cord signal.
INDIVIDUAL LEVELS: No significant spinal canal or neural foraminal stenosis.
IMPRESSION: Mild thoracic spondylosis without significant spinal canal or neural foraminal stenosis.

## 2021-01-03 IMAGING — MR MRI LUMBAR SPINE WITHOUT CONTRAST
6 of 10 series · 32 of 48 positions shown · non-contrast
Comparison: None available.

B/L UPPER AND LOWER EXTREMITY NUMBNESS AND WEAKNESS
FINAL REPORT:
EXAM: MRI LUMBAR SPINE WITHOUT CONTRAST
INDICATION: Low back pain, progressive neurologic deficit
TECHNIQUE: Multiplanar multisequence MRI of the lumbar spine without IV contrast.

[Series 6001: T2 · sagittal · 4.0mm · 0.62mm/px · 4 of 17 slices shown (1 of 2)]
[im 1/17]
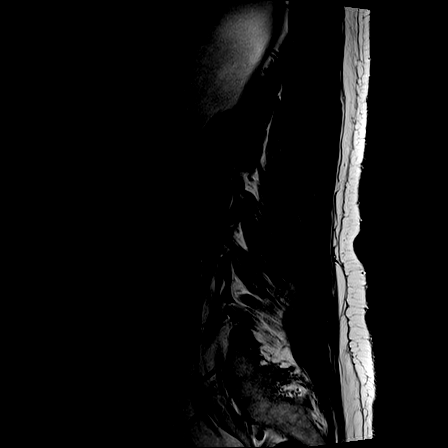
[im 6/17]
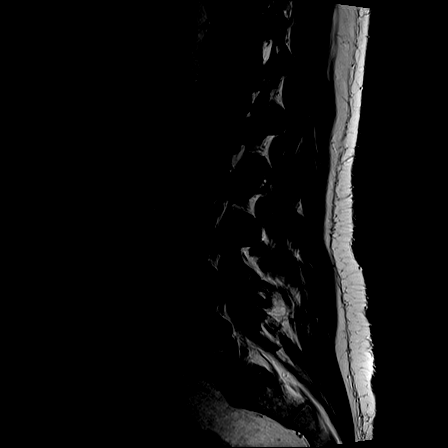
[im 11/17]
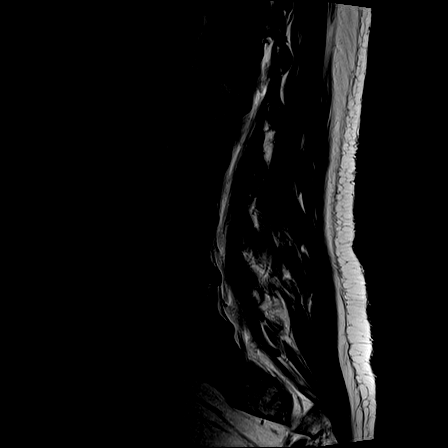
[im 17/17]
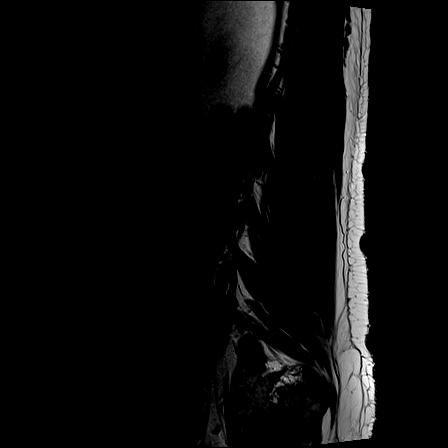

[Series 7001: STIR · sagittal · 4.0mm · 0.62mm/px · 4 of 17 slices shown (1 of 2)]
[im 1/17]
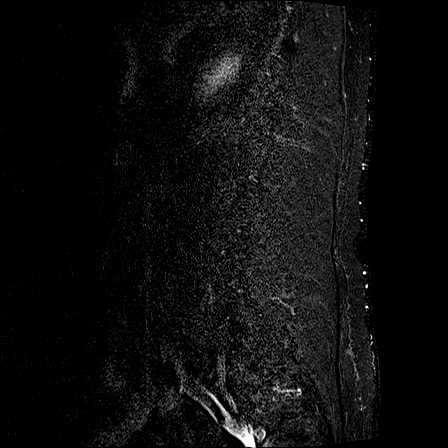
[im 6/17]
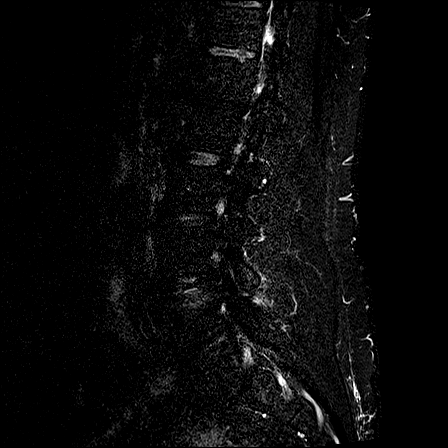
[im 11/17]
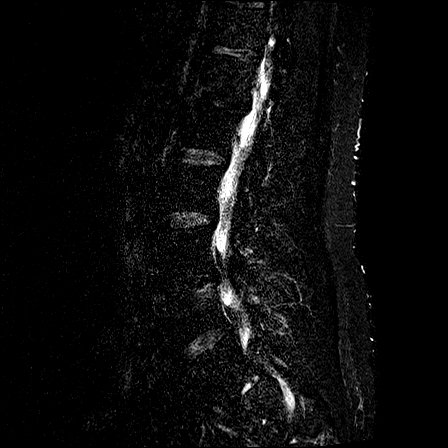
[im 17/17]
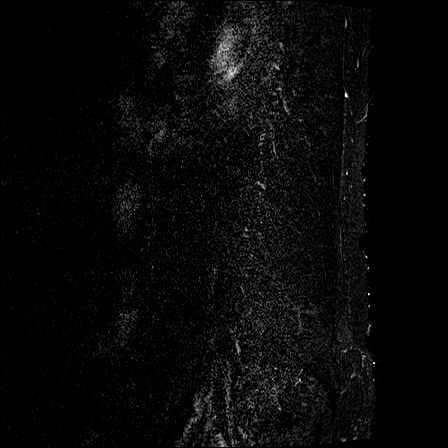

[Series 8001: T1 · sagittal · 4.0mm · 0.62mm/px · 4 of 17 slices shown (1 of 2)]
[im 1/17]
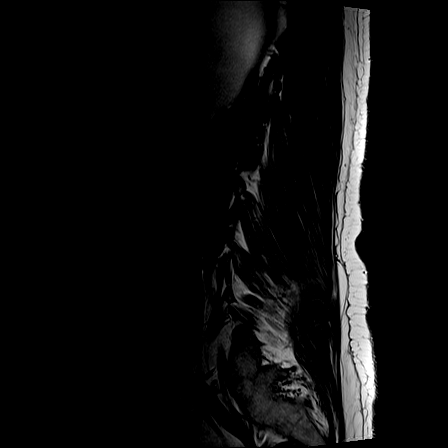
[im 6/17]
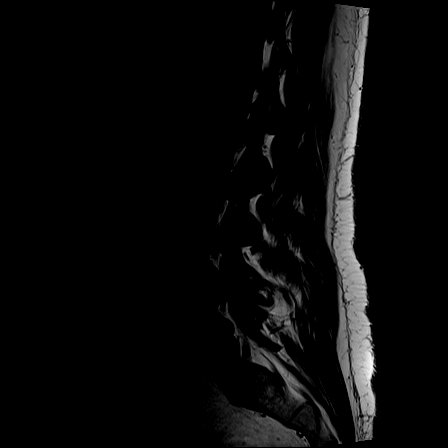
[im 11/17]
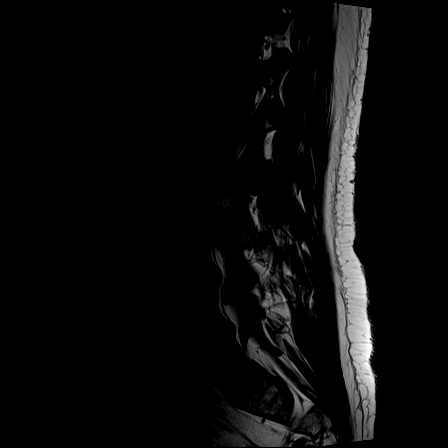
[im 17/17]
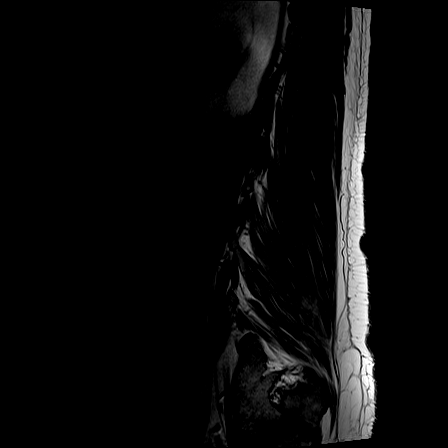

[Series 9001: T2 · axial · 4.0mm · 0.62mm/px · z∈[-123,+91]mm · 9 of 35 slices shown (2 of 2)]
[im 1/35]
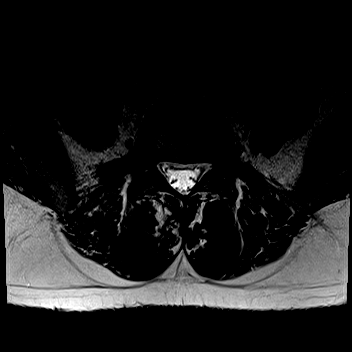
[im 5/35]
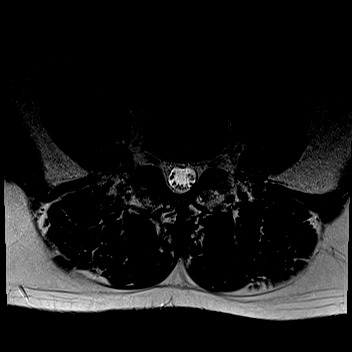
[im 9/35]
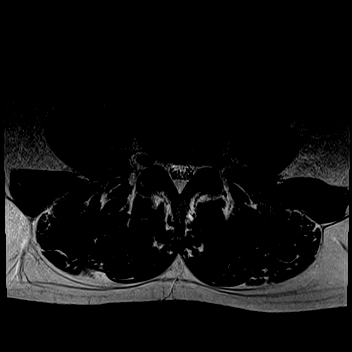
[im 13/35]
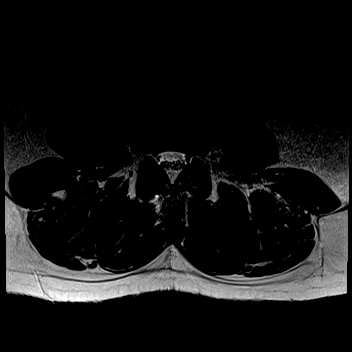
[im 18/35]
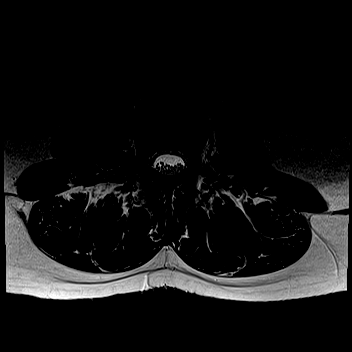
[im 22/35]
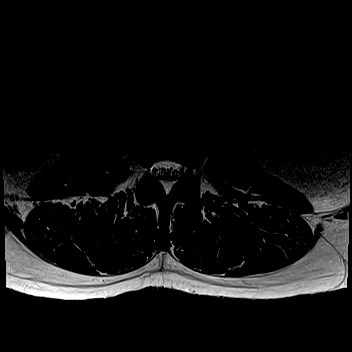
[im 26/35]
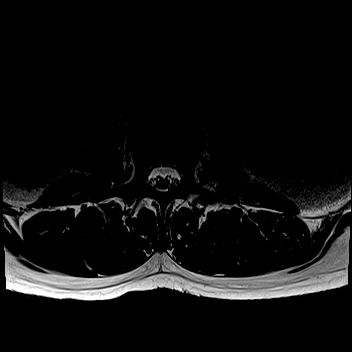
[im 30/35]
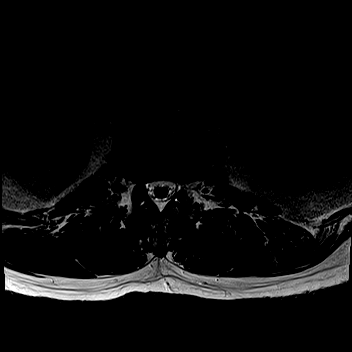
[im 35/35]
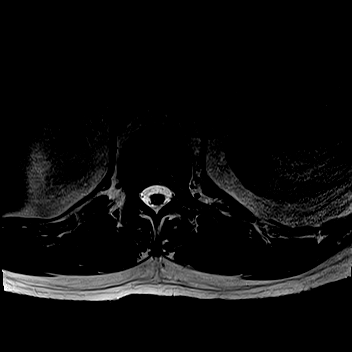

[T1 · axial · 4.0mm · 0.62mm/px · z∈[-123,+91]mm · 9 of 35 slices shown (2 of 2)]
[im 1/35]
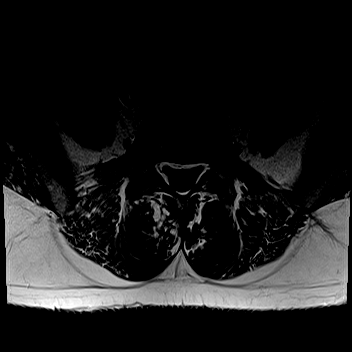
[im 5/35]
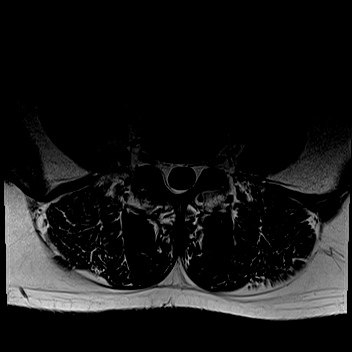
[im 9/35]
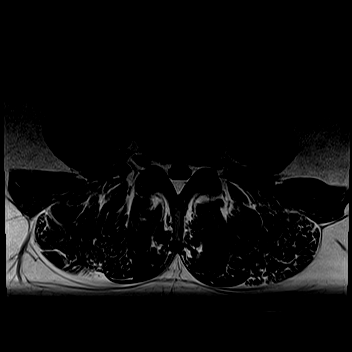
[im 13/35]
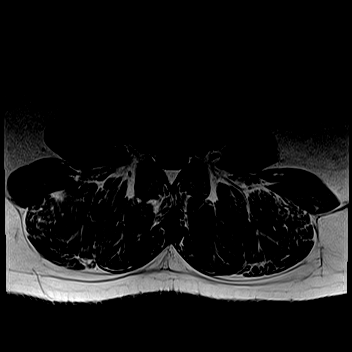
[im 18/35]
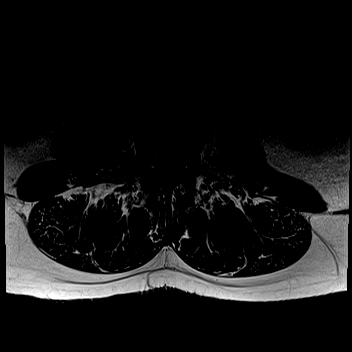
[im 22/35]
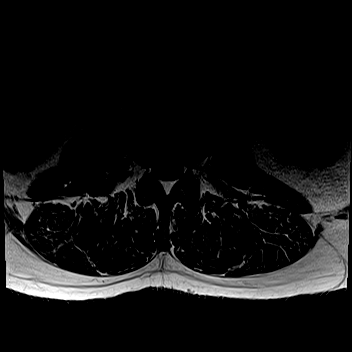
[im 26/35]
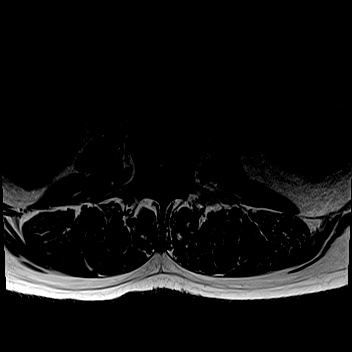
[im 30/35]
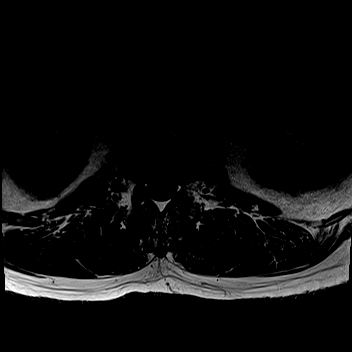
[im 35/35]
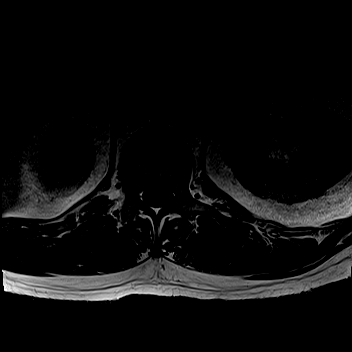

[STIR · sagittal · 4.0mm · 0.62mm/px · 2 of 17 slices shown (2 of 2)]
[im 1/17]
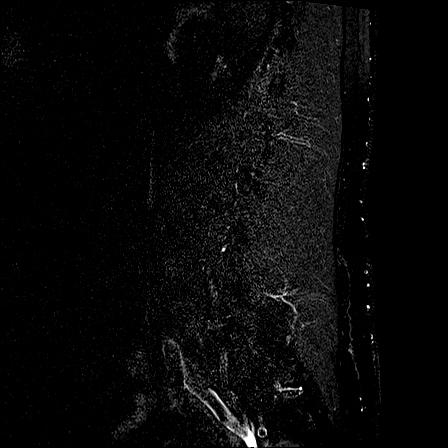
[im 6/17]
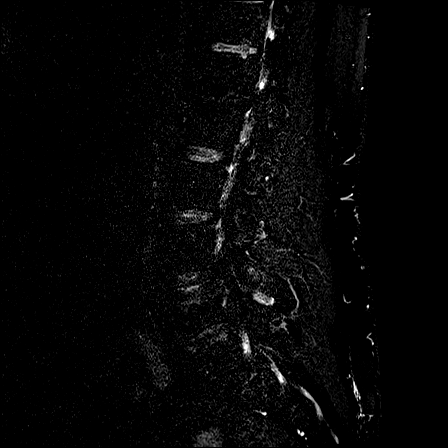

[32 of 48 positions shown; findings below may reference images not displayed]

FINDINGS: LUMBAR SEGMENTATION: This dictation assumes the most caudal fully segmented vertebra is labeled L5. 5 lumbar type vertebral bodies.
VERTEBRAL BODIES/ALIGNMENT: No acute fracture or traumatic malalignment.
MARROW: Multilevel reactive endplate changes, with Modic type II changes noted at L1-2, L4-5, and L5-S1.
INTERVERTEBRAL DISCS: Multilevel disc height loss and desiccation.
PARAVERTEBRAL SOFT TISSUES: Unremarkable.
ADDITIONAL SOFT TISSUES: Unremarkable.
INTRATHECAL CONTENTS: No abnormal cord signal. The conus terminates at a normal level.
INDIVIDUAL LEVELS:
T12-L1: No significant spinal canal or neural foraminal stenosis.
L1-L2: Disc bulge and facet hypertrophy causing mild right neural foraminal narrowing.
L2-L3: Mild disc bulge and facet hypertrophy without significant spinal canal or neural foraminal stenosis.
L3-L4: Mild disc bulge and facet hypertrophy without significant spinal canal or neural foraminal stenosis.
L4-L5: Disc bulge, facet hypertrophy, ligamentum flavum hypertrophy causing mild spinal canal and moderate right and moderate to severe left neural foraminal stenosis.
L5-S1: Disc bulge and facet hypertrophy causing mild bilateral neural foraminal narrowing.
IMPRESSION: Lumbar spondylosis, most significant at L4-5 where there is mild spinal canal and moderate to severe left neural foraminal stenosis.

## 2021-01-03 IMAGING — MR MRI CERVICAL SPINE WITHOUT CONTRAST
7 series · 48 of 48 positions shown · non-contrast
Comparison: None available.

PT HAVING EXTREME NECK PAIN WITH BILATERAL NUMBNESS AND WEAKNESS IN UPPER AND LOWER EXTREMETIES
FINAL REPORT:
EXAM: MRI CERVICAL SPINE WITHOUT CONTRAST
INDICATION: Motor neuron disease suspected
TECHNIQUE: Multiplanar multisequence MRI of the cervical spine without IV contrast.

[Series 3001: survey_mpr_sag · sagittal · 1.7mm · 1.67mm/px · 4 of 15 slices shown]
[im 1/15]
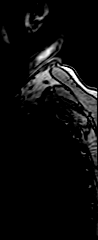
[im 5/15]
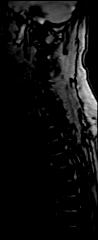
[im 10/15]
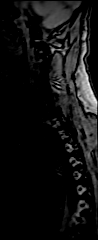
[im 15/15]
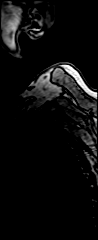

[Series 4001: survey_mpr_(person_name) · axial · 1.7mm · 1.67mm/px · z∈[-150,+150]mm · 2 of 7 slices shown]
[im 1/7]
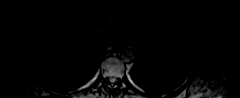
[im 7/7]
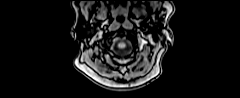

[Series 5001: T2 · sagittal · 3.0mm · 0.38mm/px · 5 of 15 slices shown (1 of 2)]
[im 1/15]
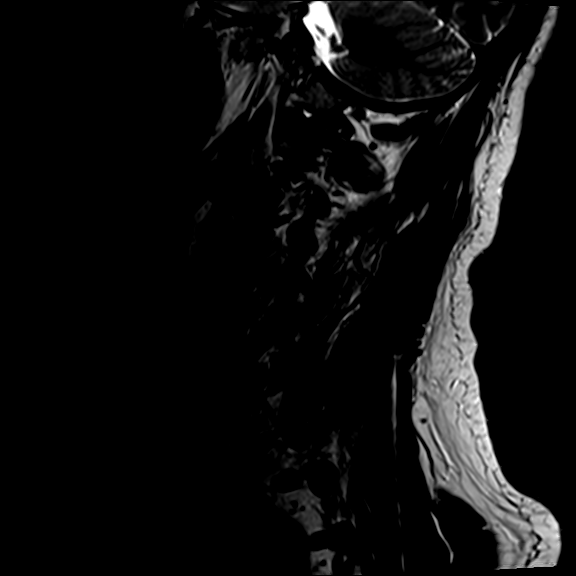
[im 4/15]
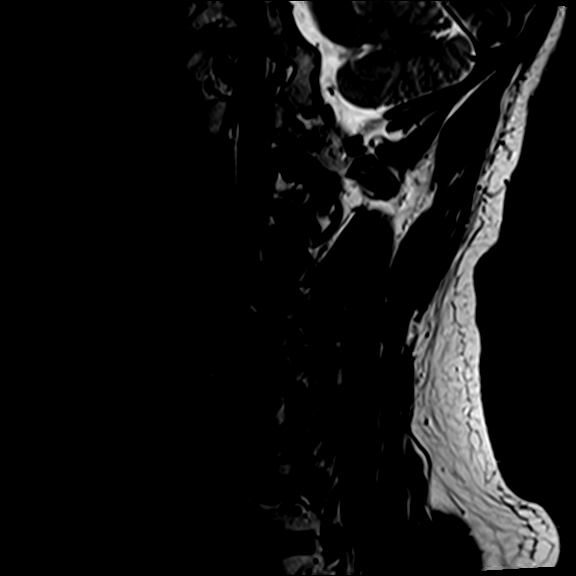
[im 8/15]
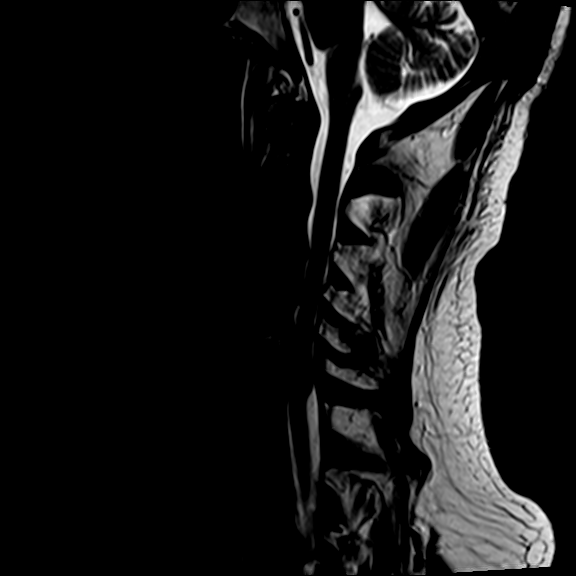
[im 11/15]
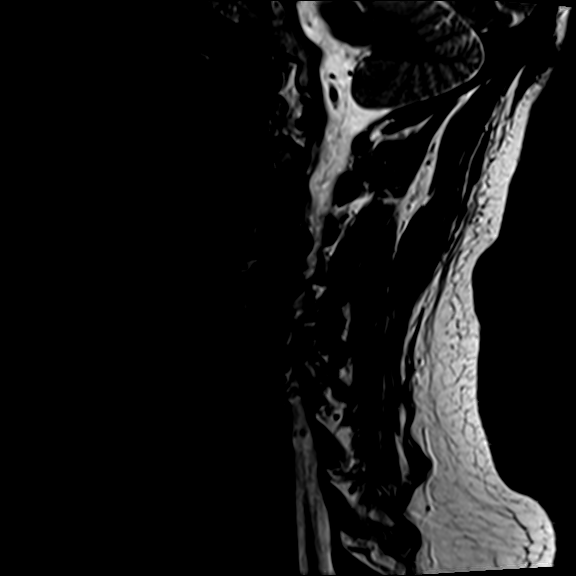
[im 15/15]
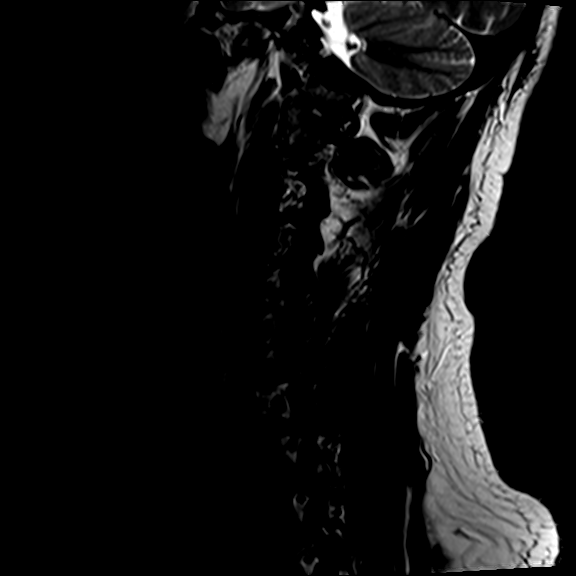

[Series 6001: T1 · sagittal · 3.0mm · 0.69mm/px · 5 of 15 slices shown]
[im 1/15]
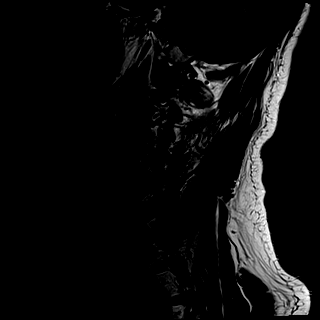
[im 4/15]
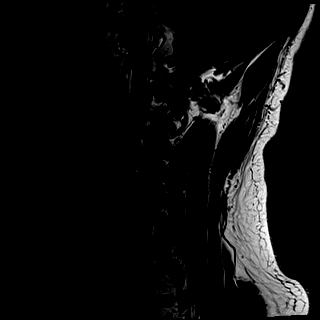
[im 8/15]
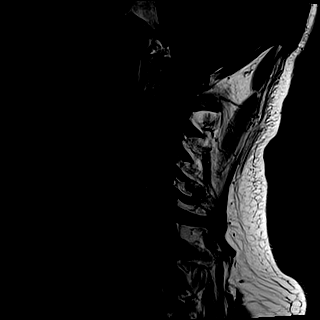
[im 11/15]
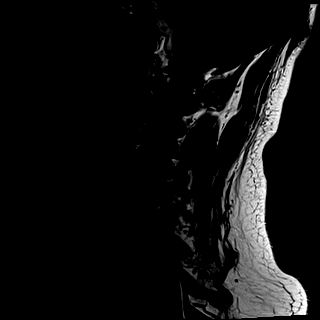
[im 15/15]
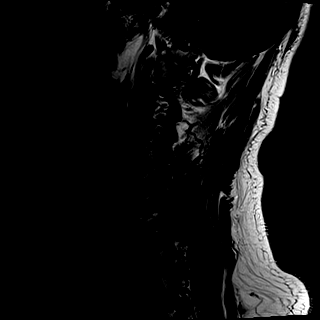

[Series 7001: STIR · sagittal · 3.0mm · 0.86mm/px · 5 of 15 slices shown]
[im 1/15]
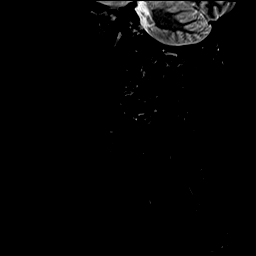
[im 4/15]
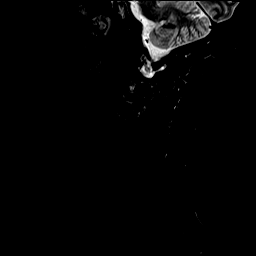
[im 8/15]
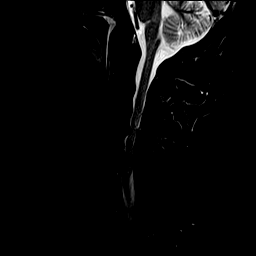
[im 11/15]
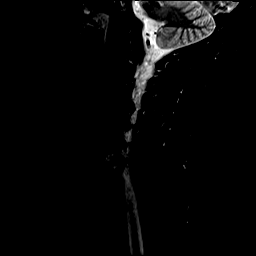
[im 15/15]
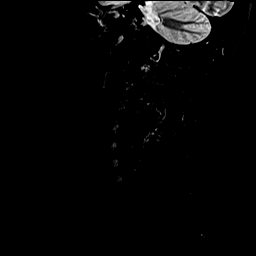

[Series 8001: GRE · axial · 3.0mm · 0.70mm/px · z∈[+7,+107]mm · 10 of 32 slices shown]
[im 1/32]
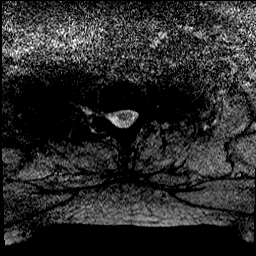
[im 4/32]
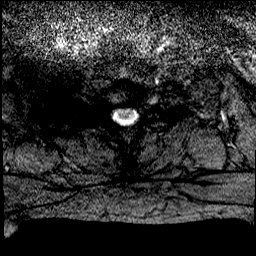
[im 7/32]
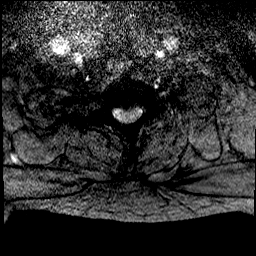
[im 11/32]
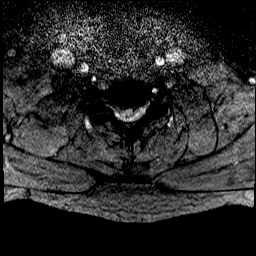
[im 14/32]
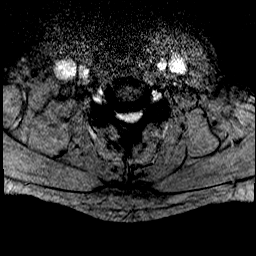
[im 18/32]
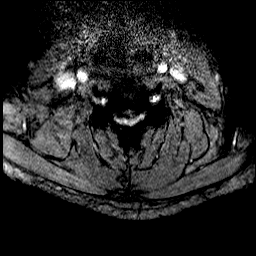
[im 21/32]
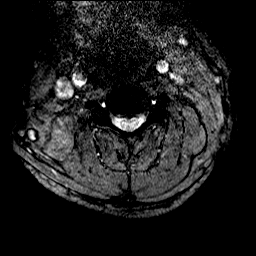
[im 25/32]
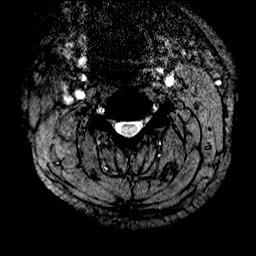
[im 28/32]
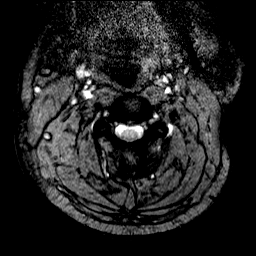
[im 32/32]
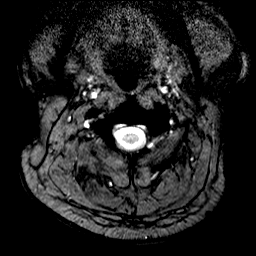

[Series 9001: T2 · axial · 2.0mm · 0.70mm/px · z∈[+7,+107]mm · 17 of 52 slices shown (2 of 2)]
[im 1/52]
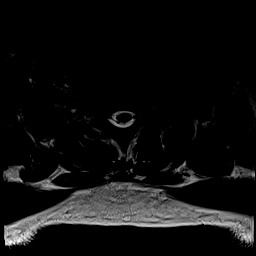
[im 4/52]
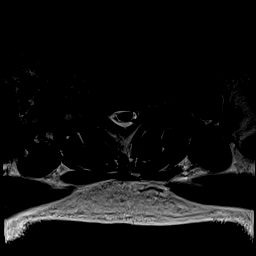
[im 7/52]
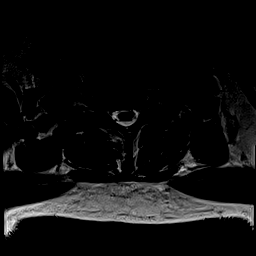
[im 10/52]
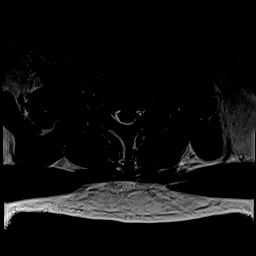
[im 13/52]
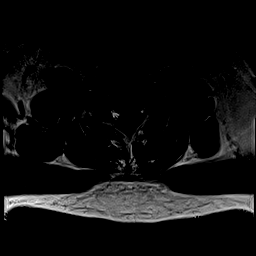
[im 16/52]
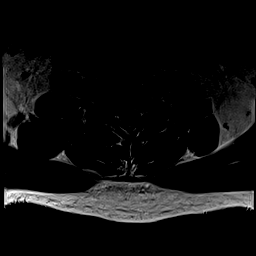
[im 20/52]
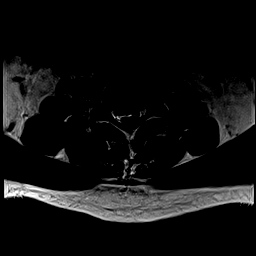
[im 23/52]
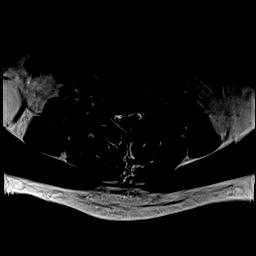
[im 26/52]
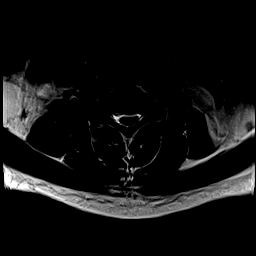
[im 29/52]
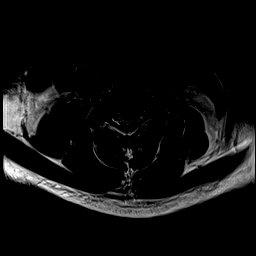
[im 32/52]
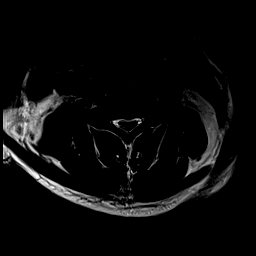
[im 36/52]
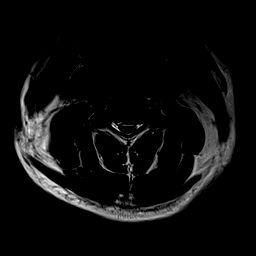
[im 39/52]
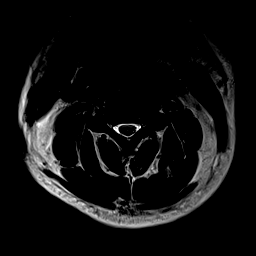
[im 42/52]
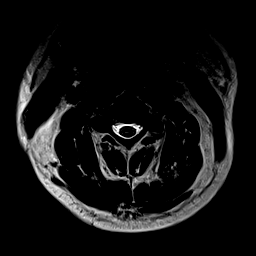
[im 45/52]
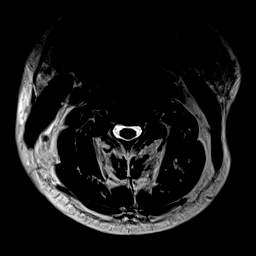
[im 48/52]
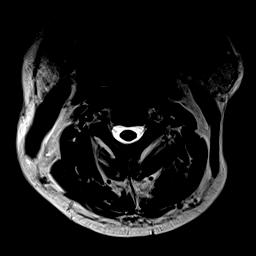
[im 52/52]
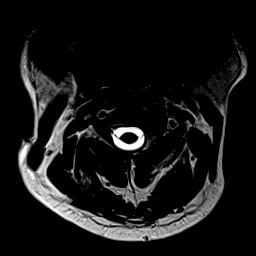

[48 of 48 positions shown; findings below may reference images not displayed]

FINDINGS: VISUALIZED INTRACRANIAL CONTENTS: Unremarkable.
VERTEBRAL BODIES/ALIGNMENT: Straightening of the cervical spine. No acute fracture or traumatic malalignment.
MARROW: Reactive endplate changes at C6-7.
INTERVERTEBRAL DISCS: Multilevel disc height loss and desiccation.
PARAVERTEBRAL SOFT TISSUES: Unremarkable.
ADDITIONAL SOFT TISSUES: Unremarkable.
INTRATHECAL CONTENTS: Hyperintense cord signal abnormality noted from C5-7.
INDIVIDUAL LEVELS:
C1-C2: No significant spinal canal stenosis.
C2-C3: No significant spinal canal or neural foraminal stenosis.
C3-C4: Disc osteophyte complex, uncovertebral hypertrophy and facet arthropathy causing moderate spinal canal and severe bilateral neural foraminal stenosis.
C4-C5: Disc osteophyte complex, uncovertebral hypertrophy and facet arthropathy causing severe spinal canal and neural foraminal stenosis, right greater than left.
C5-C6: Disc osteophyte complex, uncovertebral hypertrophy and facet arthropathy causing mild spinal canal and severe bilateral neural foraminal stenosis.
C6-C7: Large disc extrusion measuring up to 1.3 x 1.0 cm, uncovertebral hypertrophy and facet arthropathy causing severe spinal canal and neural foraminal stenosis.
C7-T1: No significant spinal canal or neural foraminal stenosis.
IMPRESSION: Cervical spondylosis, most significant at C6-7 where there is a large 1.3 cm disc extrusion and degenerative changes causing severe spinal canal and neural foraminal stenosis.

## 2021-01-20 IMAGING — CR XR CHEST 1 VIEW
1 series · 1 of 1 positions shown · non-contrast
Comparison: Chest x-ray from 09/26/2020

ETT placement
FINAL REPORT:
INDICATION: ETT placement

[AP]
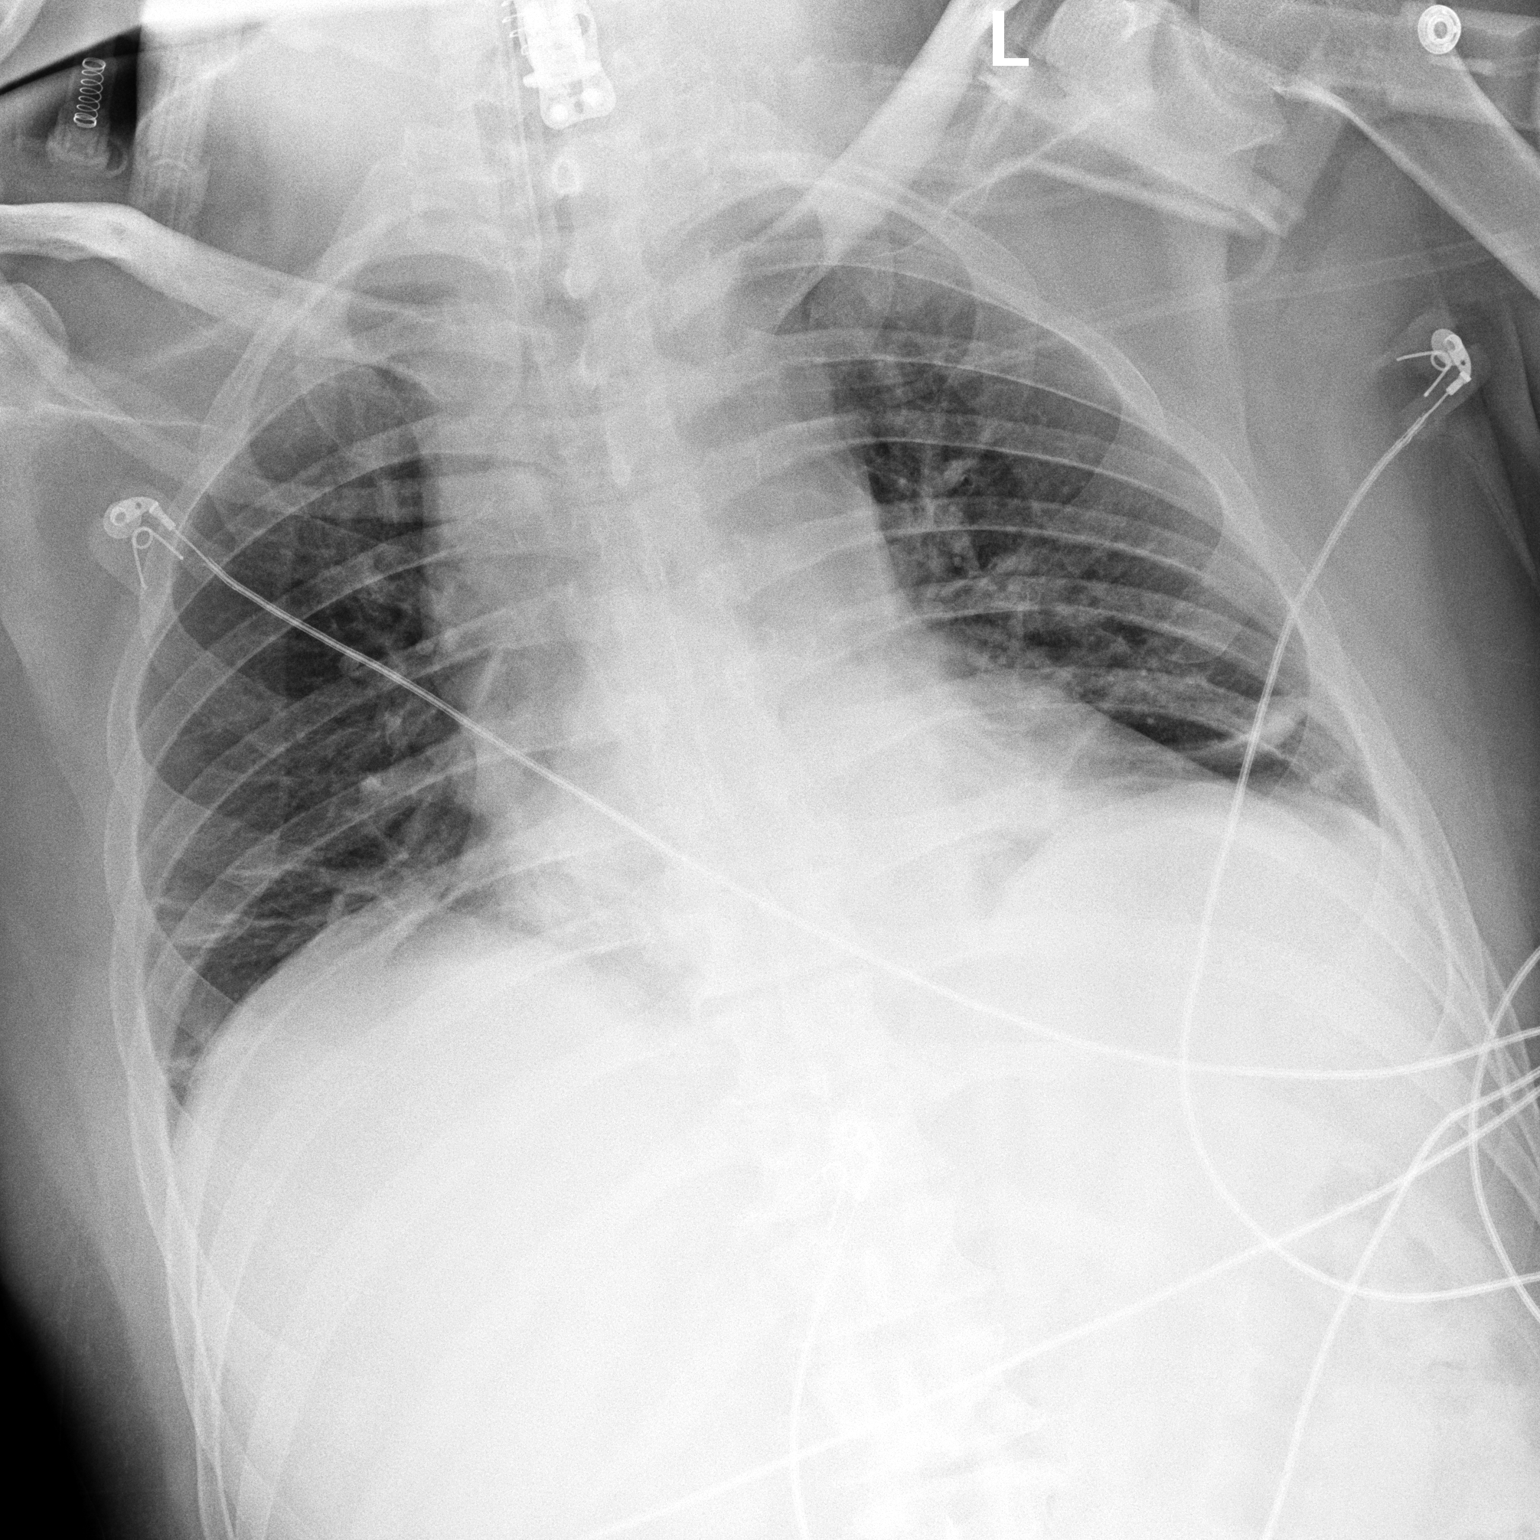

[1 of 1 positions shown; findings below may reference images not displayed]

FINDINGS: 1 view of the chest. Endotracheal tube reaches 8.1 cm above the carina above the level the clavicular heads.  The cardiomediastinal contours are within normal limits. No evidence of focal consolidation, pleural effusion, pulmonary edema, or pneumothorax. Bibasilar linear atelectasis
IMPRESSION: 
IMPRESSION: 1. High position of the endotracheal tube. Considering advancing by 4 cm.
Portable?
Yes

## 2021-01-21 IMAGING — CR XR ABDOMEN 1 VIEW
1 series · 2 of 2 positions shown · non-contrast
Comparison: none

FINAL REPORT:
HISTORY: NG tube placement

[Series 7326: AP · 2 of 2 slices shown]
[im 1/2]
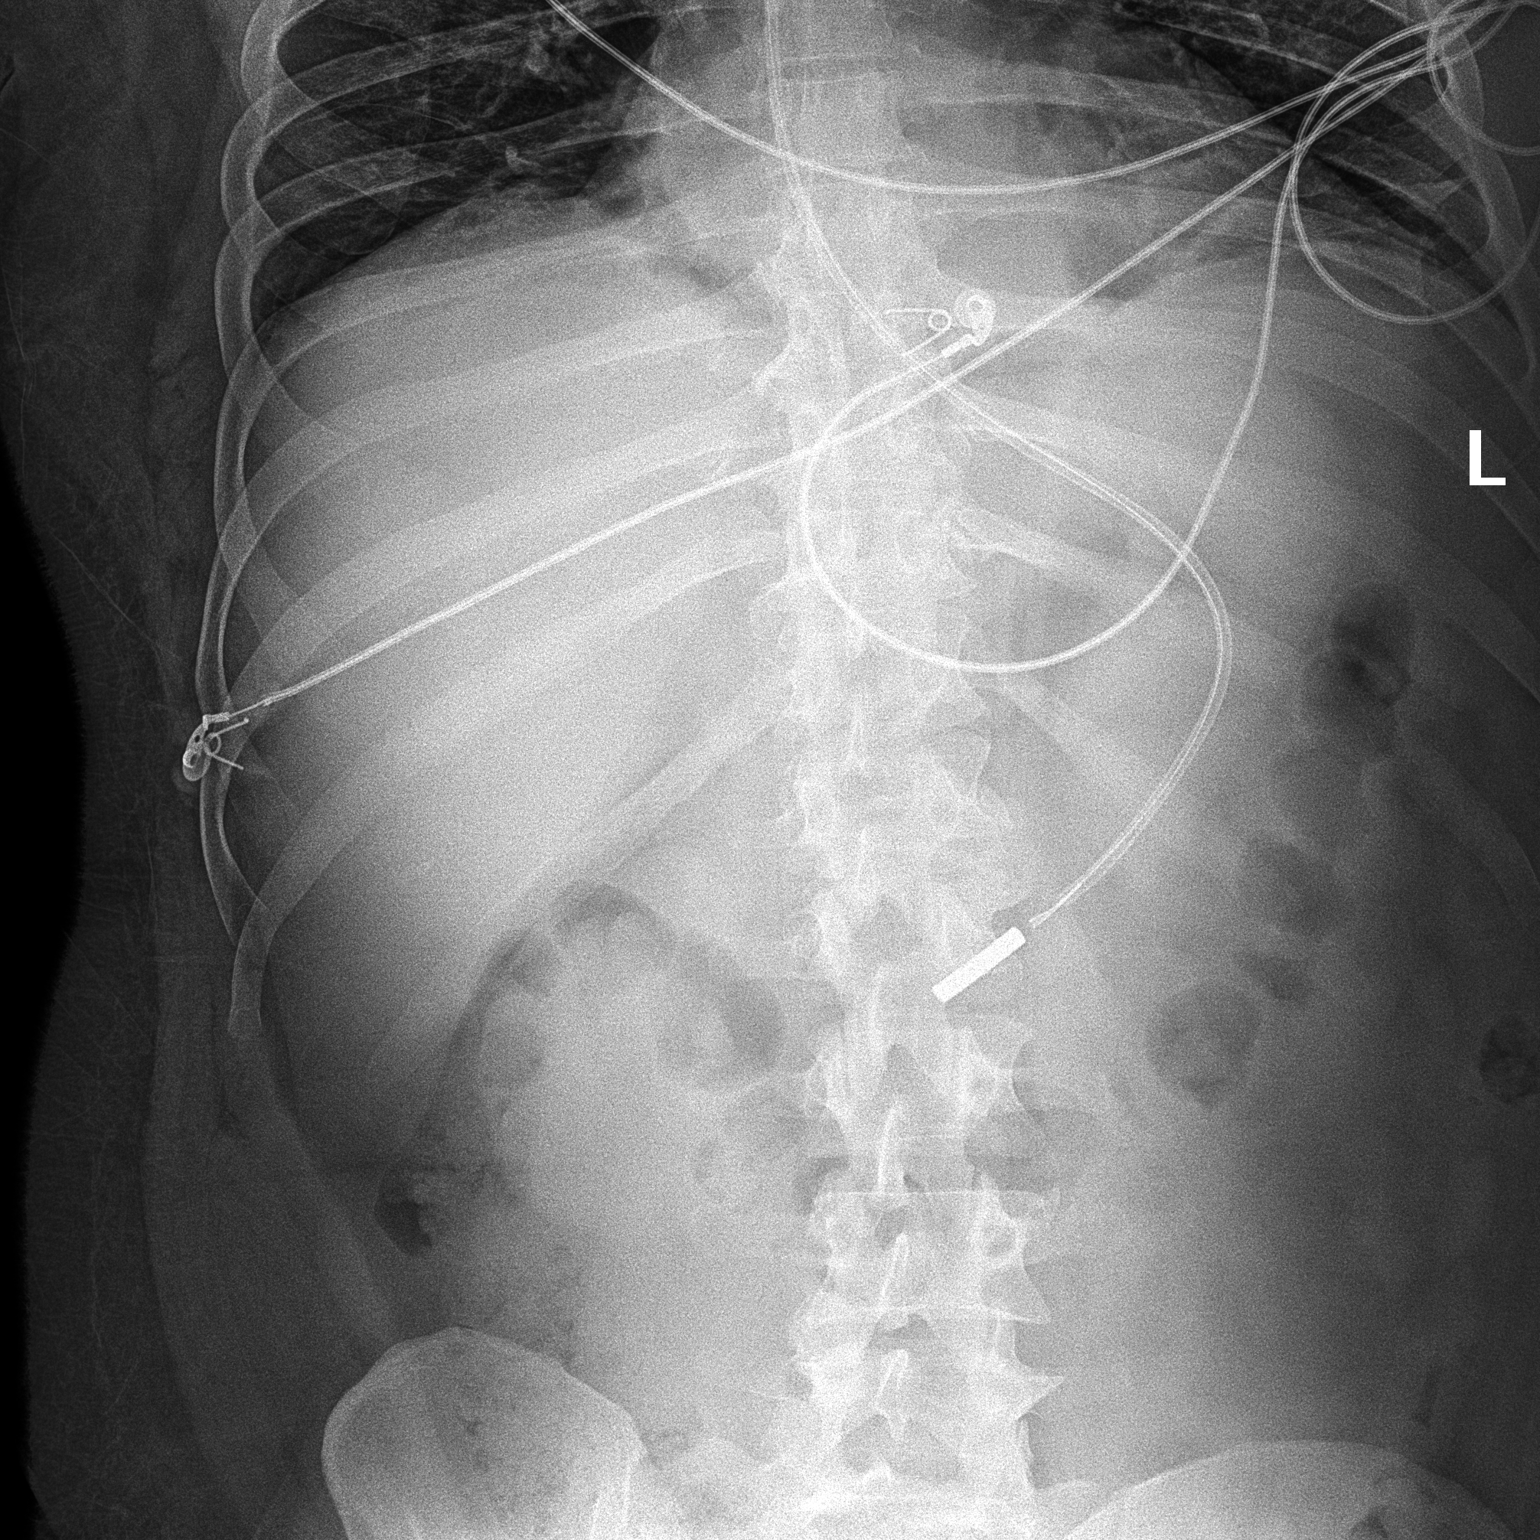
[im 2/2]
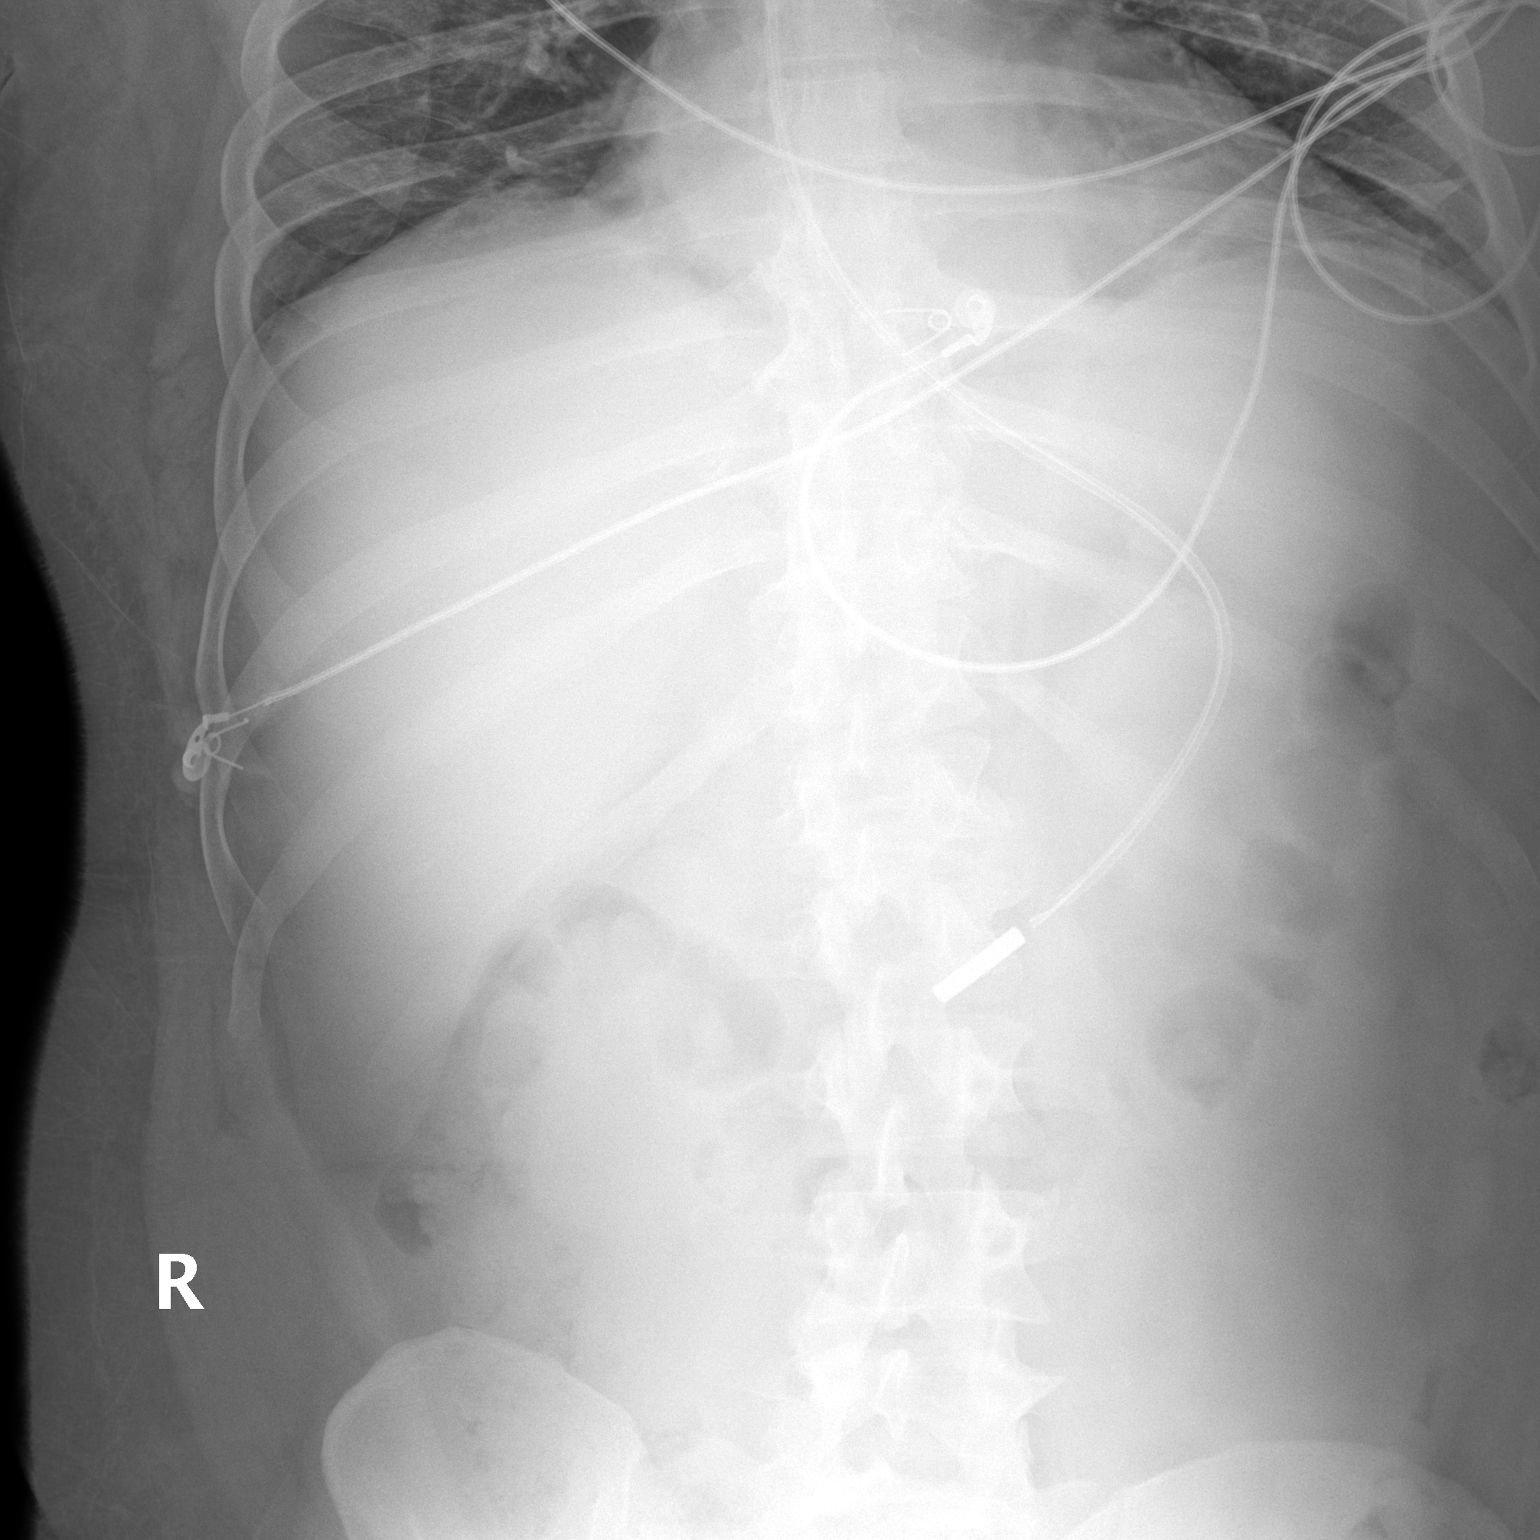

[2 of 2 positions shown; findings below may reference images not displayed]

FINDINGS: KUB is without priors. A feeding tube is below the diaphragm and likely within the mid to distal stomach. The lung bases are clear. Heart is not enlarged. Bowel gas pattern is unremarkable. No aggressive bone lesions are seen. Exclusion of the pelvis.
IMPRESSION: Feeding tube in the stomach

## 2021-01-23 IMAGING — DX XR CHEST 1 VIEW
1 series · 2 of 2 positions shown · non-contrast
Comparison: 01/20/2021
Endotracheal tube tip is 8 cm above the carina.

F/u tube placement
FINAL REPORT:
Chest AP portable:
Clinical indications: Endotracheal intubation

[Series 9441: AP · U · 0.10mm/px · 2 of 2 slices shown]
[im 1/2]
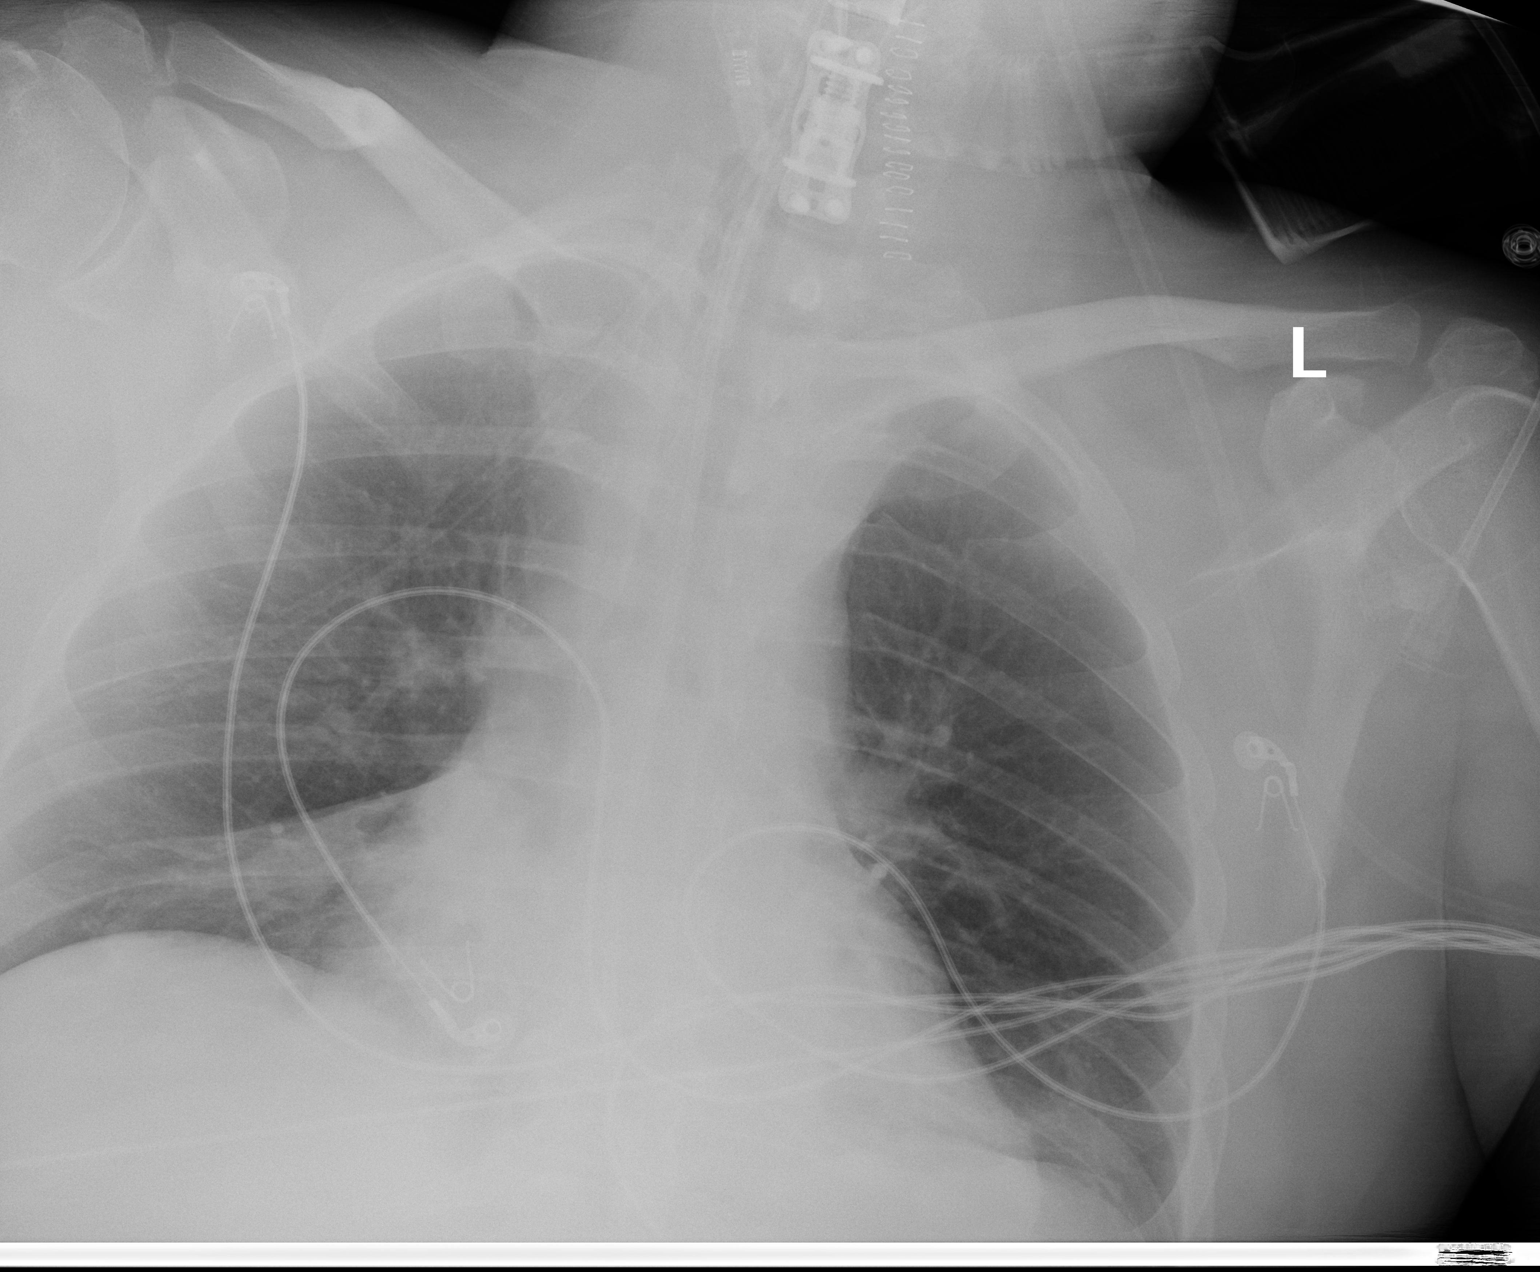
[im 2/2]
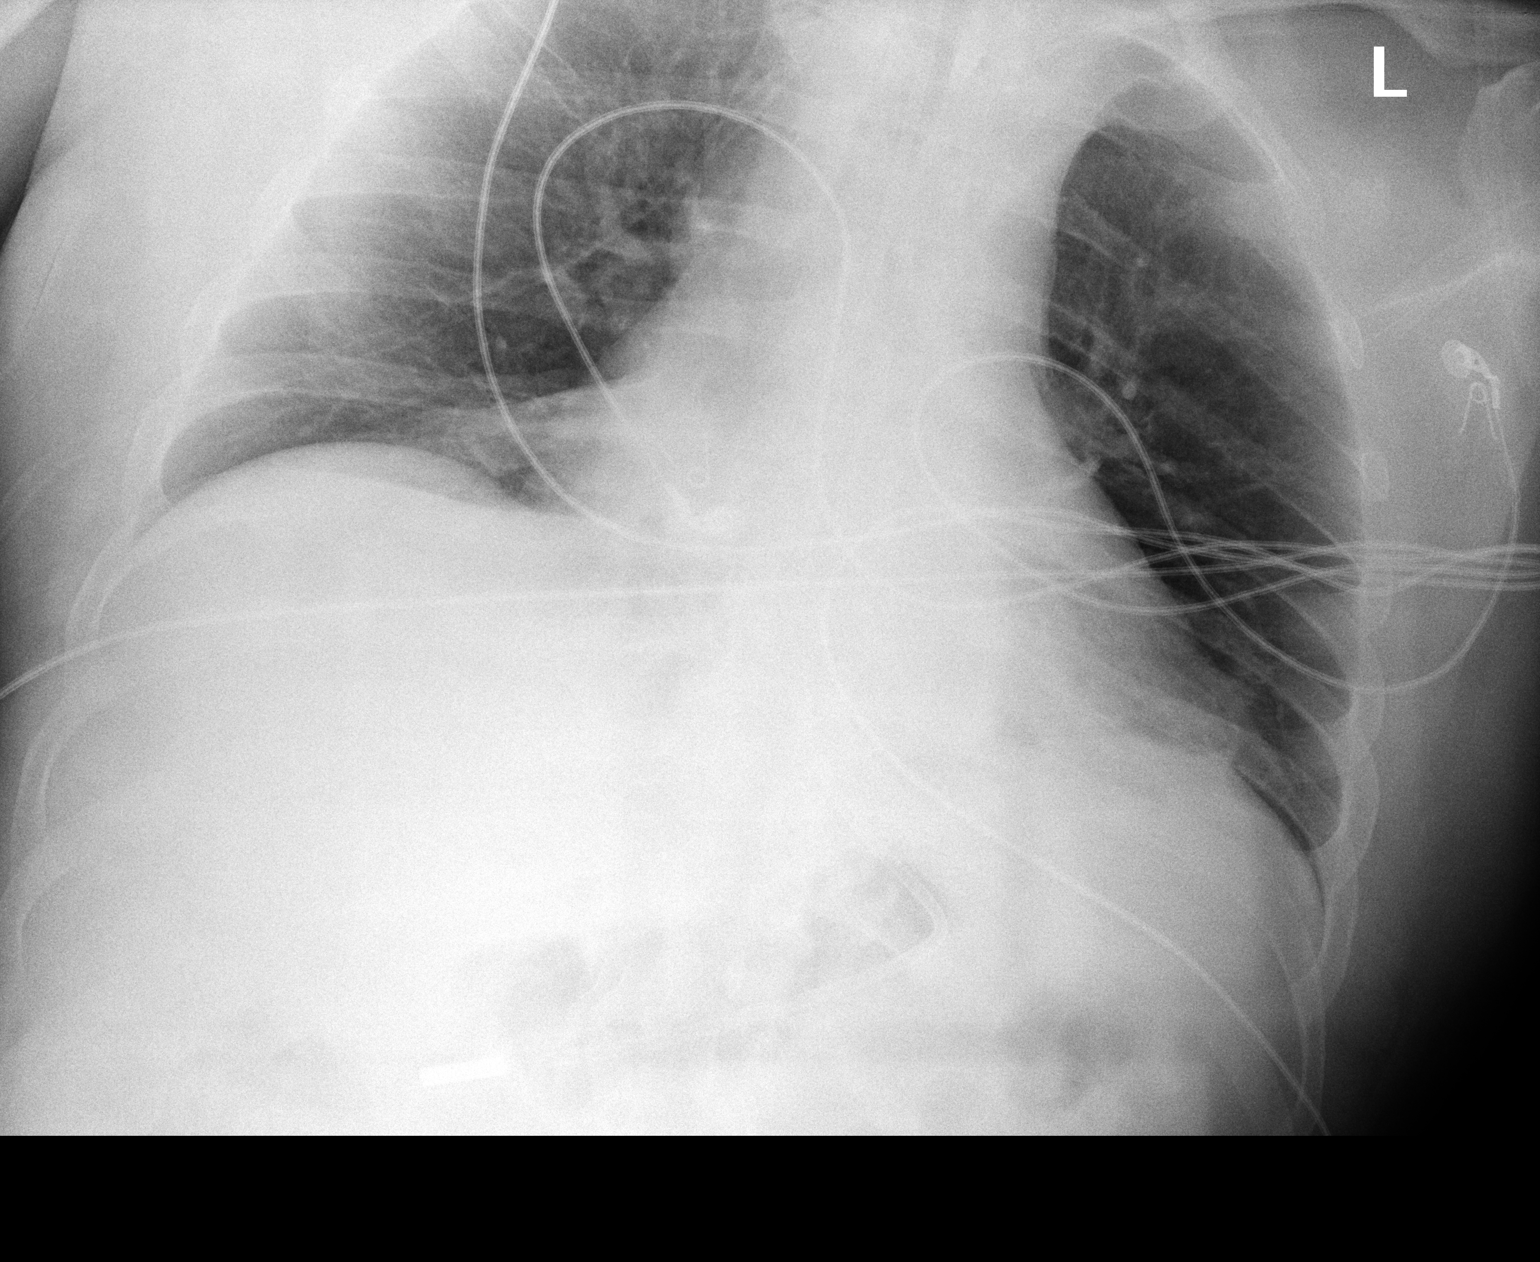

[2 of 2 positions shown; findings below may reference images not displayed]

Dobbhoff tube tip is in the stomach. There is right basilar atelectasis or infiltrate. No vascular congestion or pneumothorax is seen. The heart appears of normal size.
IMPRESSION: Invasive devices as described.
Right basilar atelectasis/infiltrate.

## 2021-01-24 IMAGING — CT CT SOFT TISSUE NECK WITHOUT CONTRAST
3 series · 15 of 33 positions shown, 18 images · non-contrast
Comparison: None available

ADDENDUM:
communicated to Dr. Locklear on the phone by physician representative on behalf of Dr. Uraaxeeyaha at [DATE] on 01/24/21.
FINAL REPORT:
INDICATION: post- cervical spine fusion; EVALUATE HEMATOMA; EVALUATE AIRWAY
Exam: CT of the neck without intravenous contrast
All CT scans at this facility use iterative reconstruction technique, dose modulation and/or weight based dosing when appropriate to reduce radiation dose to as low as reasonably achievable.

[Series 2: neck without contrast · axial · non-contrast · 0.53mm/px · z∈[-238,+10]mm · 7 of 119 slices shown, 9 images]
[im 10/119  soft-tissue]
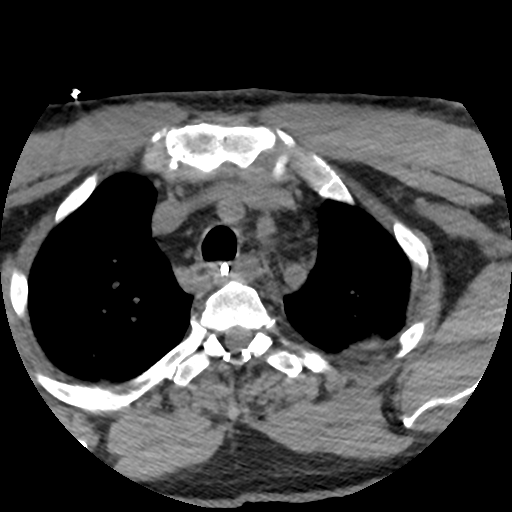
[im 10/119  bone]
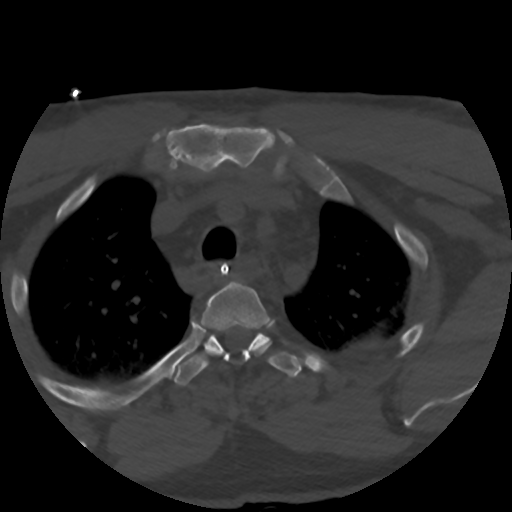
[im 28/119  bone]
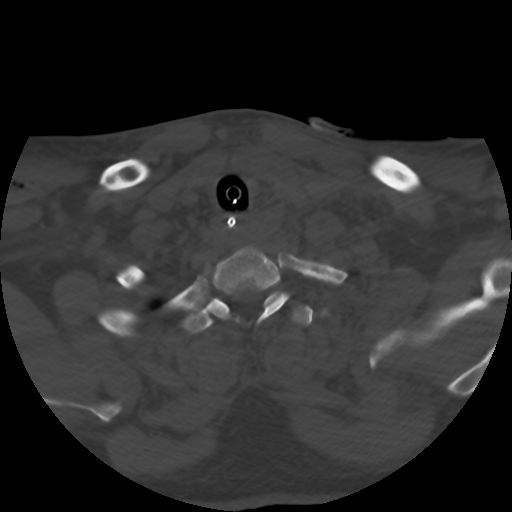
[im 46/119  bone]
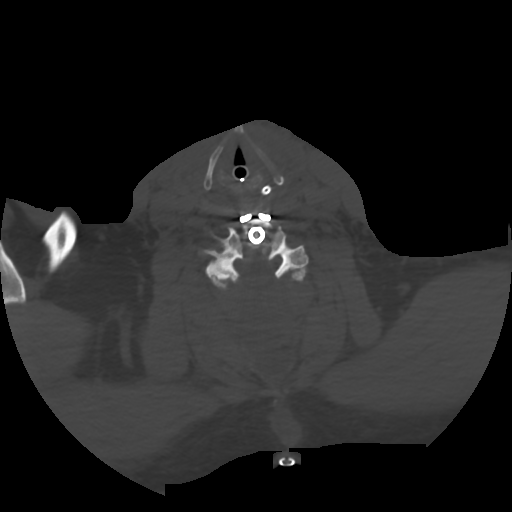
[im 64/119  bone]
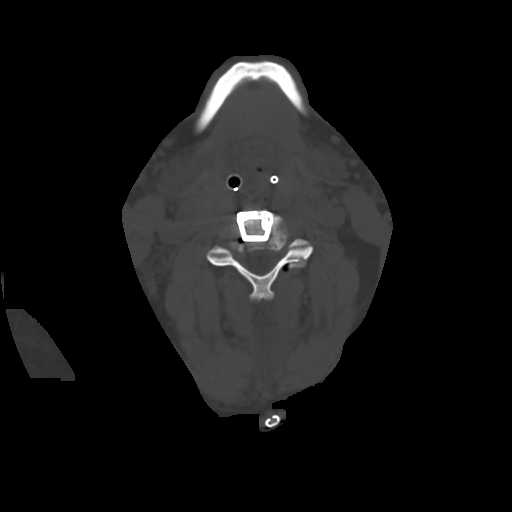
[im 73/119  soft-tissue]
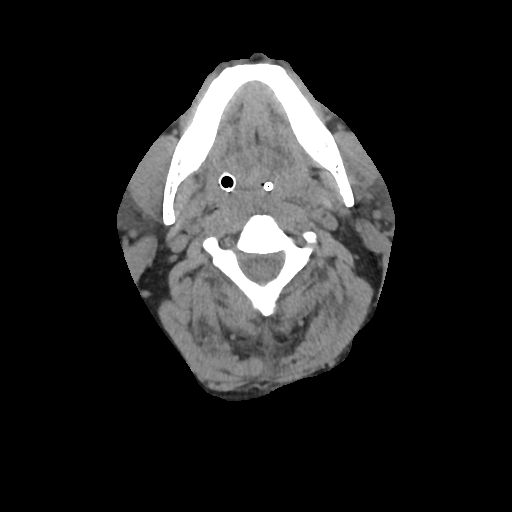
[im 73/119  bone]
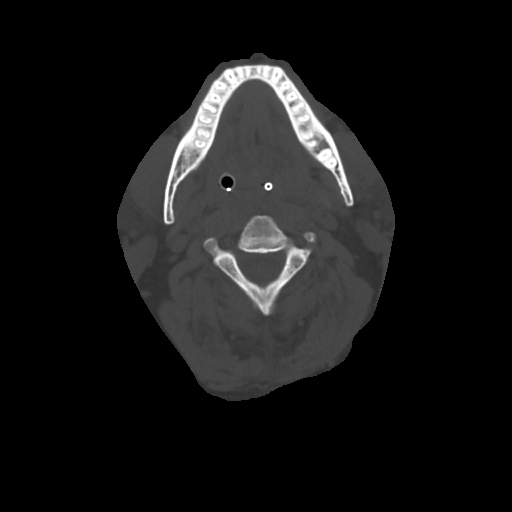
[im 91/119  bone]
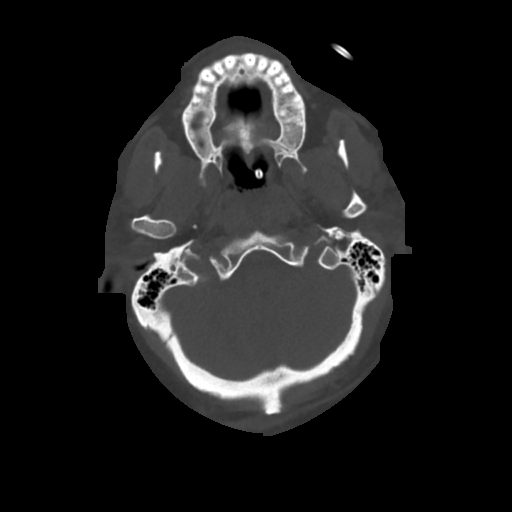
[im 109/119  bone]
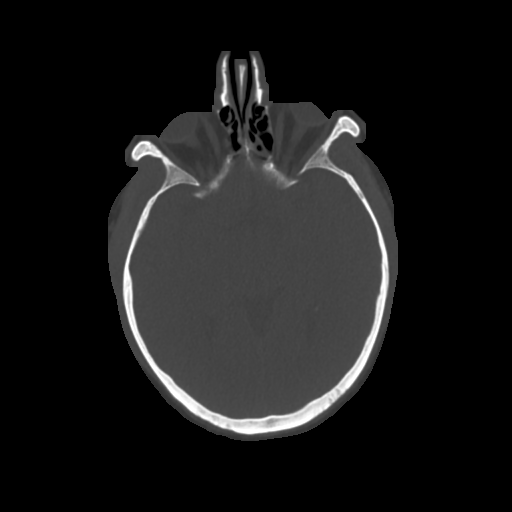

[Series 601: cor standard 2x2 · coronal · 0.58mm/px · 5 of 134 slices shown, 6 images]
[im 45/134  bone]
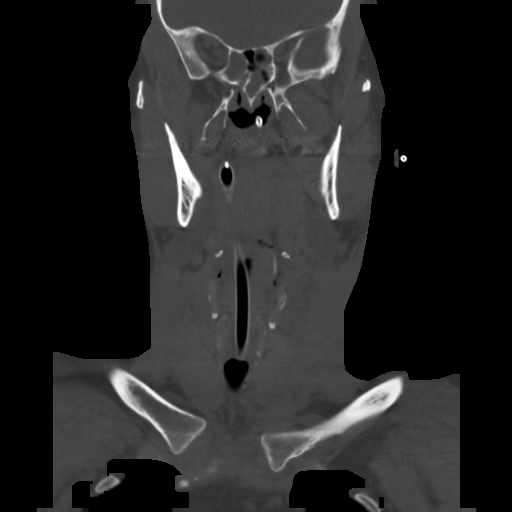
[im 56/134  bone]
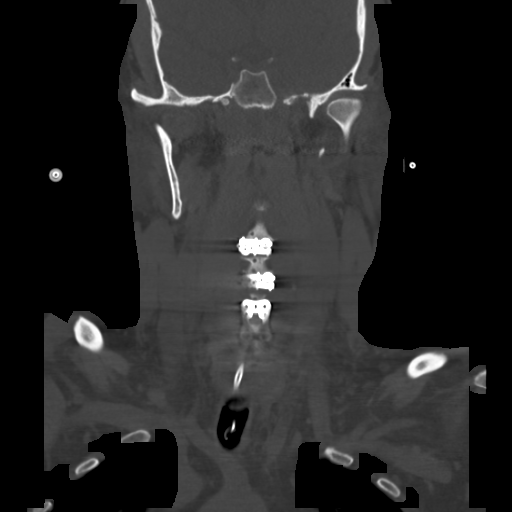
[im 67/134  soft-tissue]
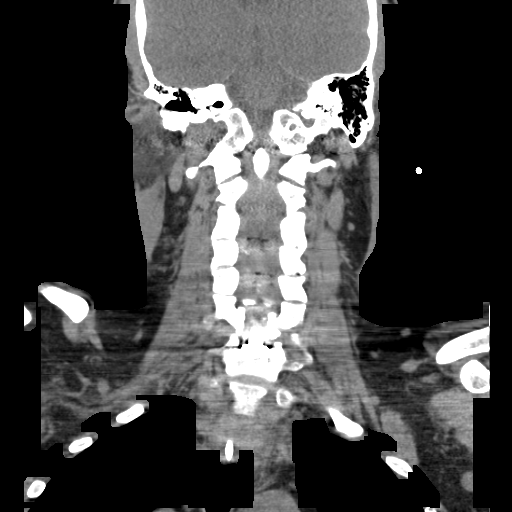
[im 67/134  bone]
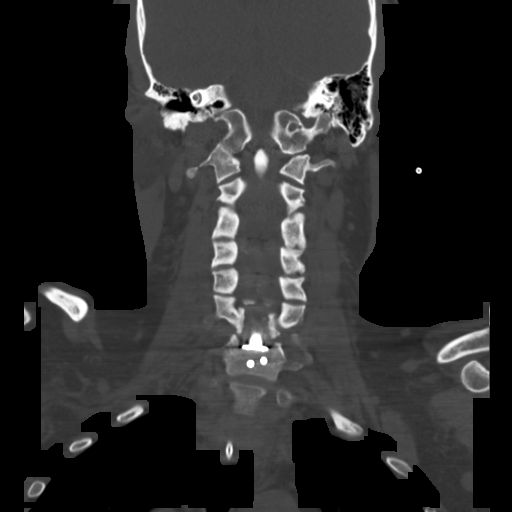
[im 78/134  bone]
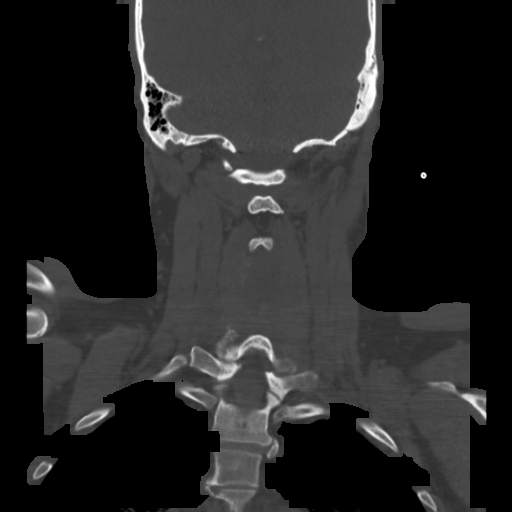
[im 89/134  bone]
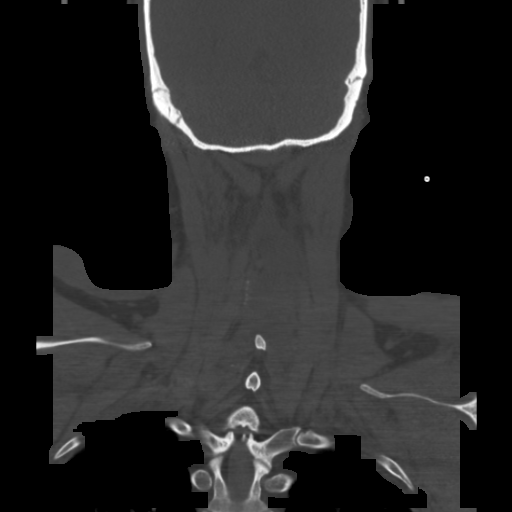

[Series 602: sag standard 2x2 · sagittal · 0.58mm/px · 3 of 136 slices shown]
[im 28/136  bone]
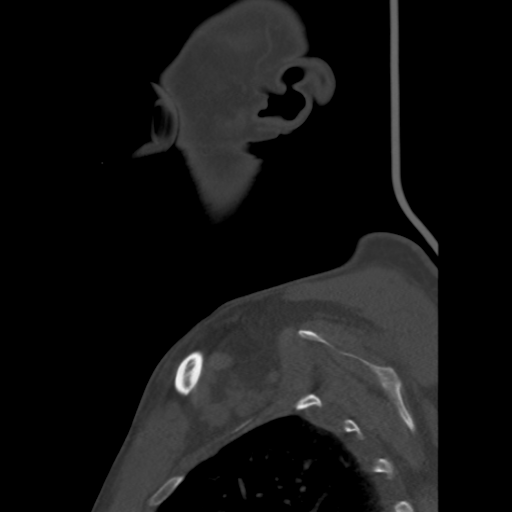
[im 55/136  bone]
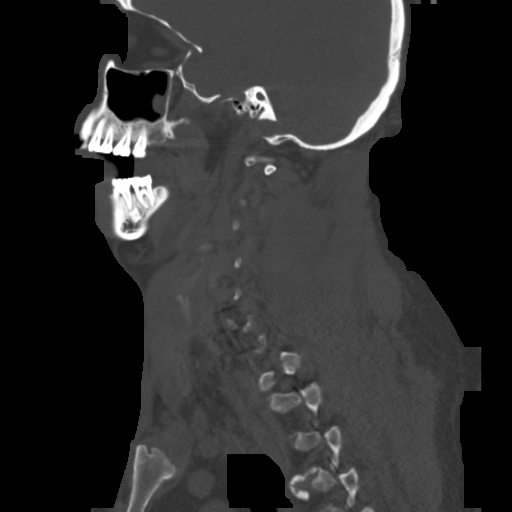
[im 82/136  bone]
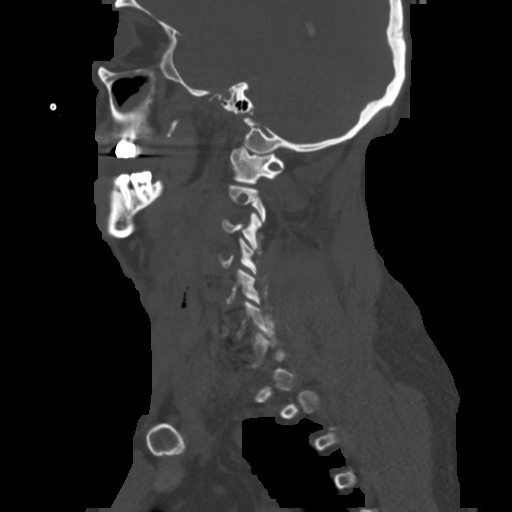

[15 of 33 positions shown; findings below may reference images not displayed]

FINDINGS: Visualized intracranial structures: No acute intracranial abnormality. There is partial opacification of the bilateral maxillary and sphenoid sinuses and bilateral ethmoid air cells.
Orbits, paranasal sinuses, and skull base: partial opacification of the bilateral maxillary and sphenoid sinuses and bilateral ethmoid air cells.
Nasopharynx: Unremarkable.
There is retropharyngeal soft tissue thickening/edema with foci of air this results in narrowing of the oropharynx and hypopharynx. There is fluid density in the supraglottic larynx with moderate narrowing of the supraglottic larynx.
There is subcutaneous edema and stranding and subcutaneous emphysema. Emphysema in the neck soft tissues.
Thyroid: Unremarkable.
Thoracic inlet: Endotracheal tube terminates well above the carina. Lung apices are unremarkable.
Lymph nodes: No pathologically enlarged cervical chain lymph node.
Vascular structures: Unremarkable.
Bones and soft tissues: Status post anterior fusion of C2-C3, C3-C4, and from C4 to C6. Intervertebral body spacers at C2-C3 and C3-C4. Status post laminectomy spanning from C3 to C5. In the laminectomy bed, there is a moderate amount of low-density fluid.
Other findings: Enteric tube terminates into the thoracic esophagus and outside the field-of-view. There is stranding in the mediastinum.
IMPRESSION: 
IMPRESSION: 1. Retropharyngeal soft tissue thickening/edema resulting in narrowing of the moderate to severe oropharynx and hypopharynx and moderate narrowing of the larynx. There is also foci of air in the retropharyngeal soft tissues. These findings could all be within expected postoperative limits. Infection cannot be ruled [DATE]. Large amount of nonorganized fluid in the cervical laminectomy bed.
3. There is stranding in the superior mediastinum. Infection cannot be ruled out.

## 2021-01-24 IMAGING — CR XR CHEST 1 VIEW
1 series · 1 of 1 positions shown · non-contrast
Comparison: None available

FINAL REPORT:
HISTORY: Respiratory failure
Number of views:1

[AP]
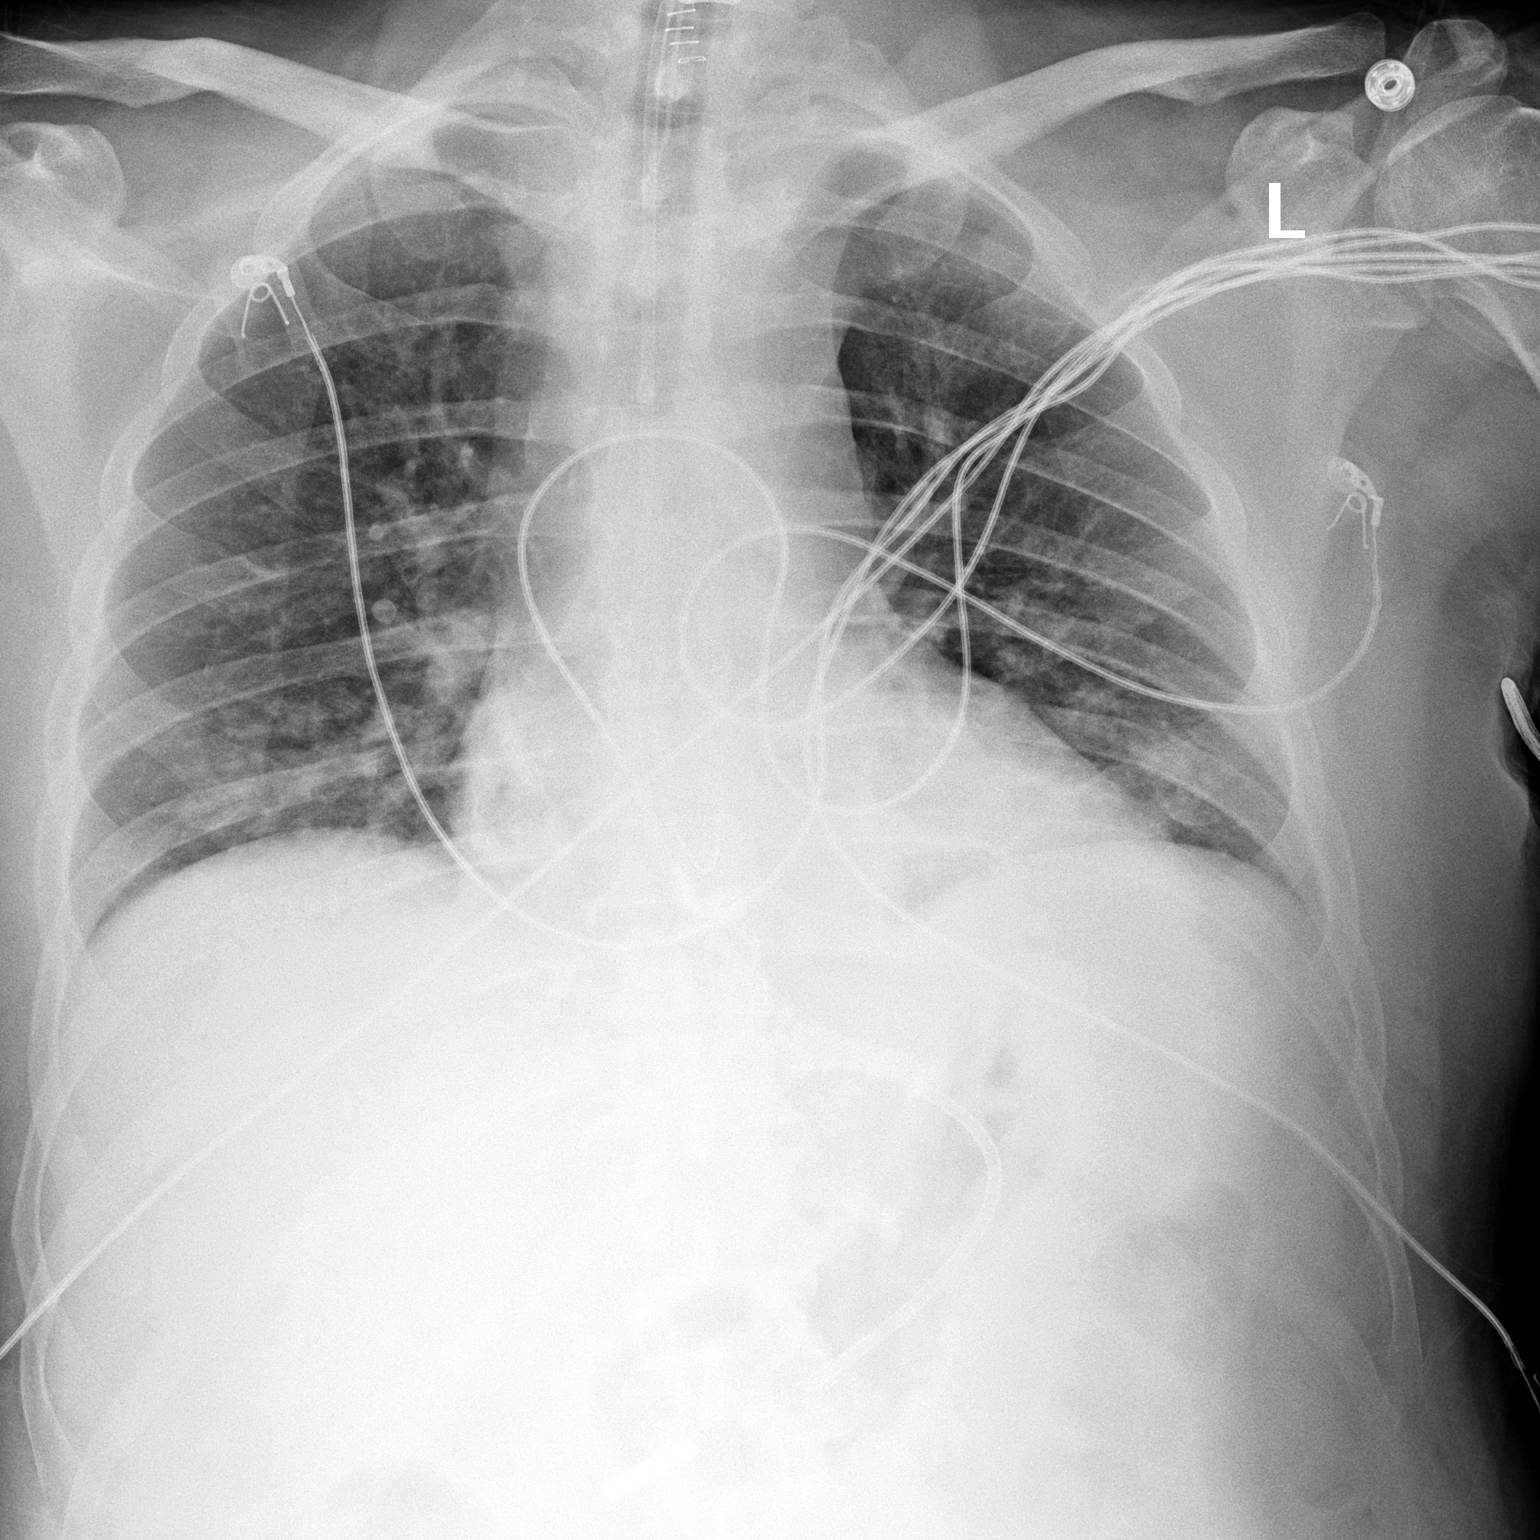

[1 of 1 positions shown; findings below may reference images not displayed]

FINDINGS: Enteric tube below the diaphragm. An endotracheal tube at the level of clavicles. Cardiomediastinal contour is unremarkable. No aggressive bone lesions are seen. Lungs are symmetrically aerated and clear
IMPRESSION: 
IMPRESSION: Line and tube placement with no focal findings
Portable?
Yes

## 2021-07-14 IMAGING — DX XR CHEST 1 VIEW
1 series · 1 of 1 positions shown · non-contrast
Comparison: 01/24/2021

FINAL REPORT:
EXAM: XR CHEST 1 VIEW
CLINICAL INDICATION: chest pain

[AP]
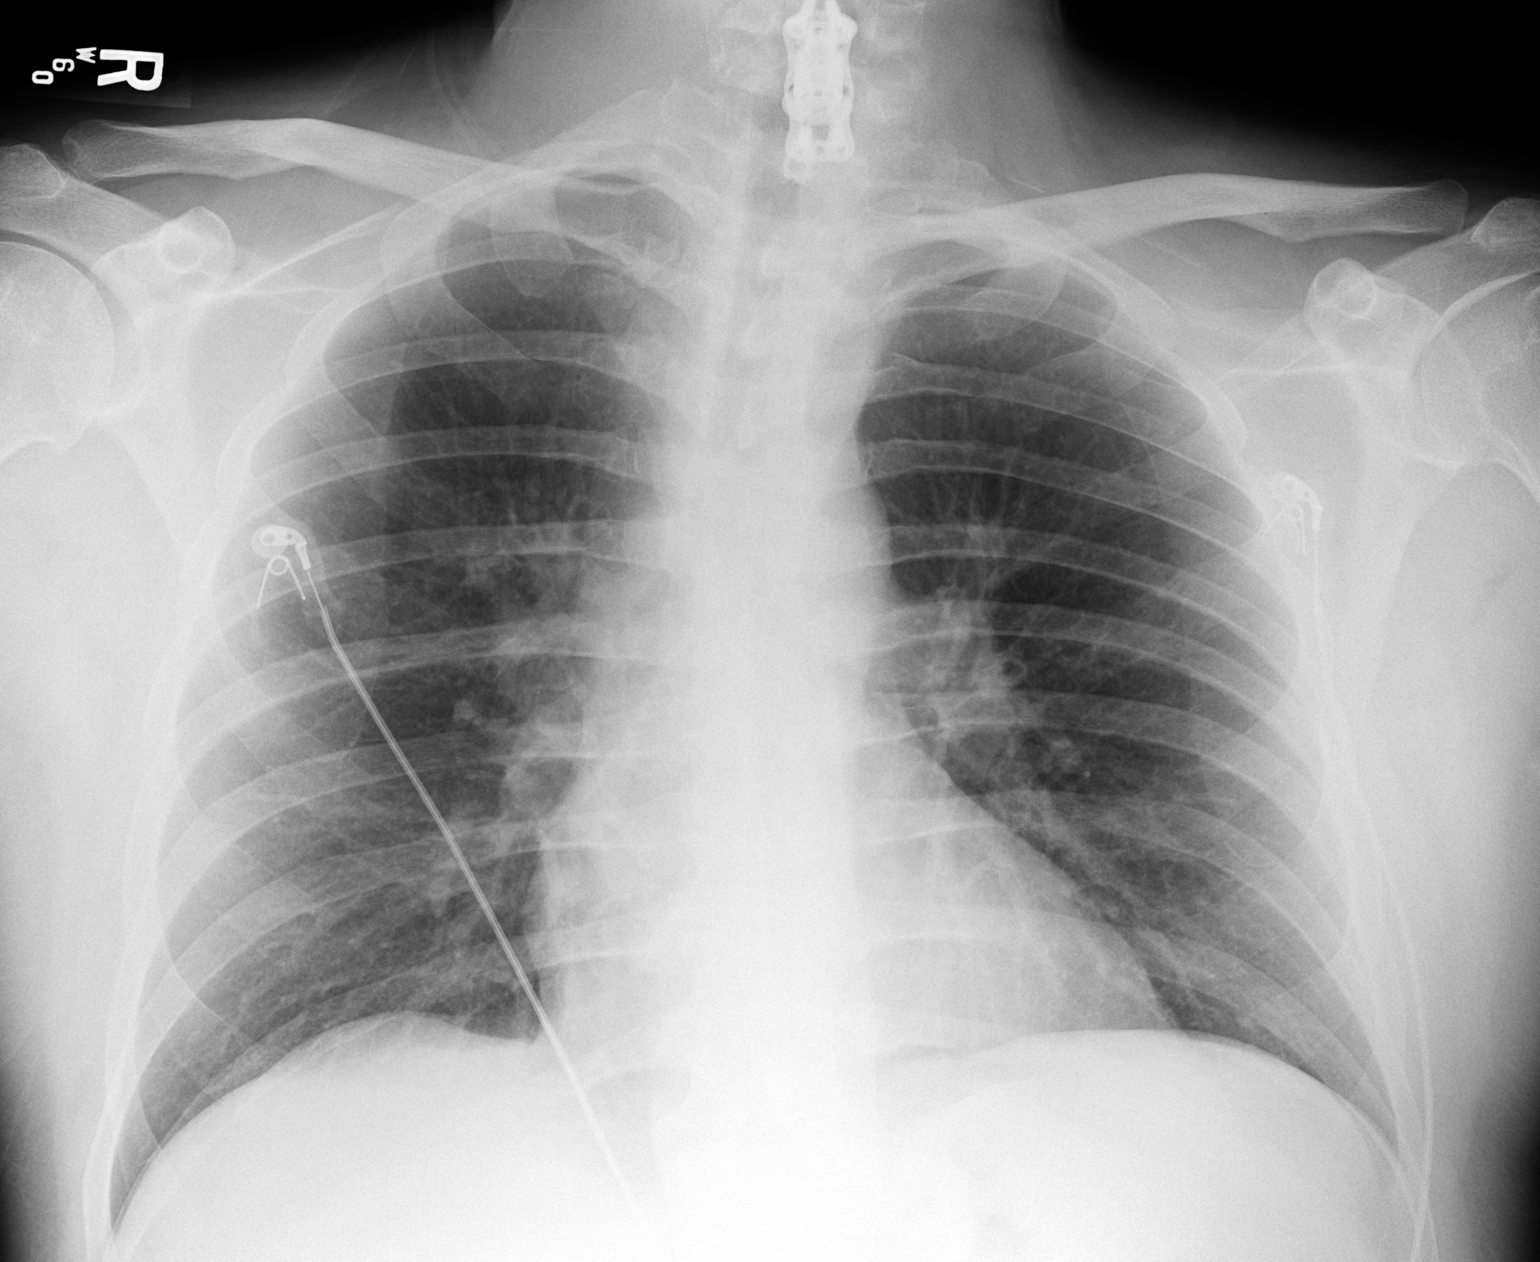

[1 of 1 positions shown; findings below may reference images not displayed]

FINDINGS: Support Devices: None
Lungs/Pleura: No focal consolidation.  No pneumothorax. No pleural effusion.
Heart/Mediastinum: Normal.
Bones/Soft tissues: No acute abnormality. Prior cervical spine fusion.
IMPRESSION: No acute cardiopulmonary abnormality.

## 2021-07-19 IMAGING — CR XR NECK SOFT TISSUE
1 series · 2 of 2 positions shown · non-contrast
Comparison: None
The pharyngeal airway appears unremarkable.

Swallowed Foreign Body (Pt c/o feeling like something is stuck in his throat x a few months. States he believes it is part of a toothpick, seen by GI and has scope scheduled for [DATE].)
FINAL REPORT:
Soft tissue neck 2 views:
Clinical indications: Foreign body sensation

[Series 6948: left lateral · 0.19mm/px · 2 of 2 slices shown]
[im 1/2]
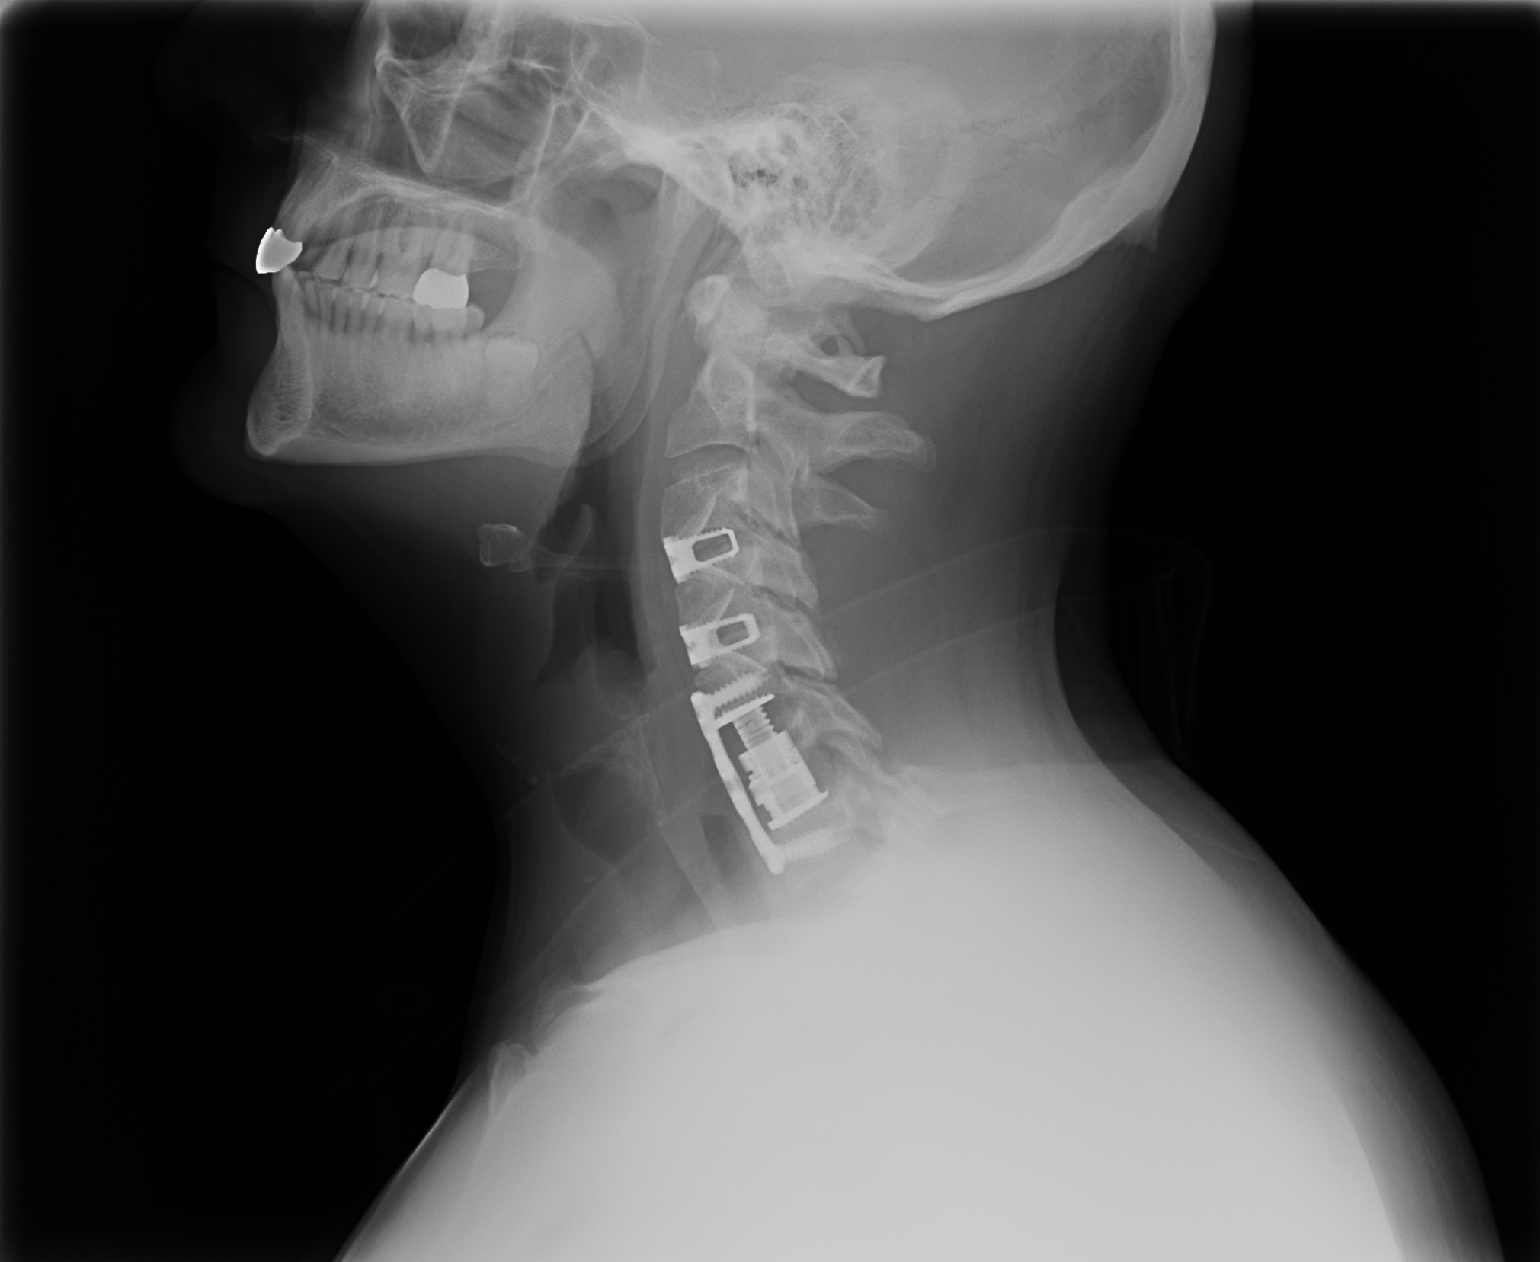
[im 2/2]
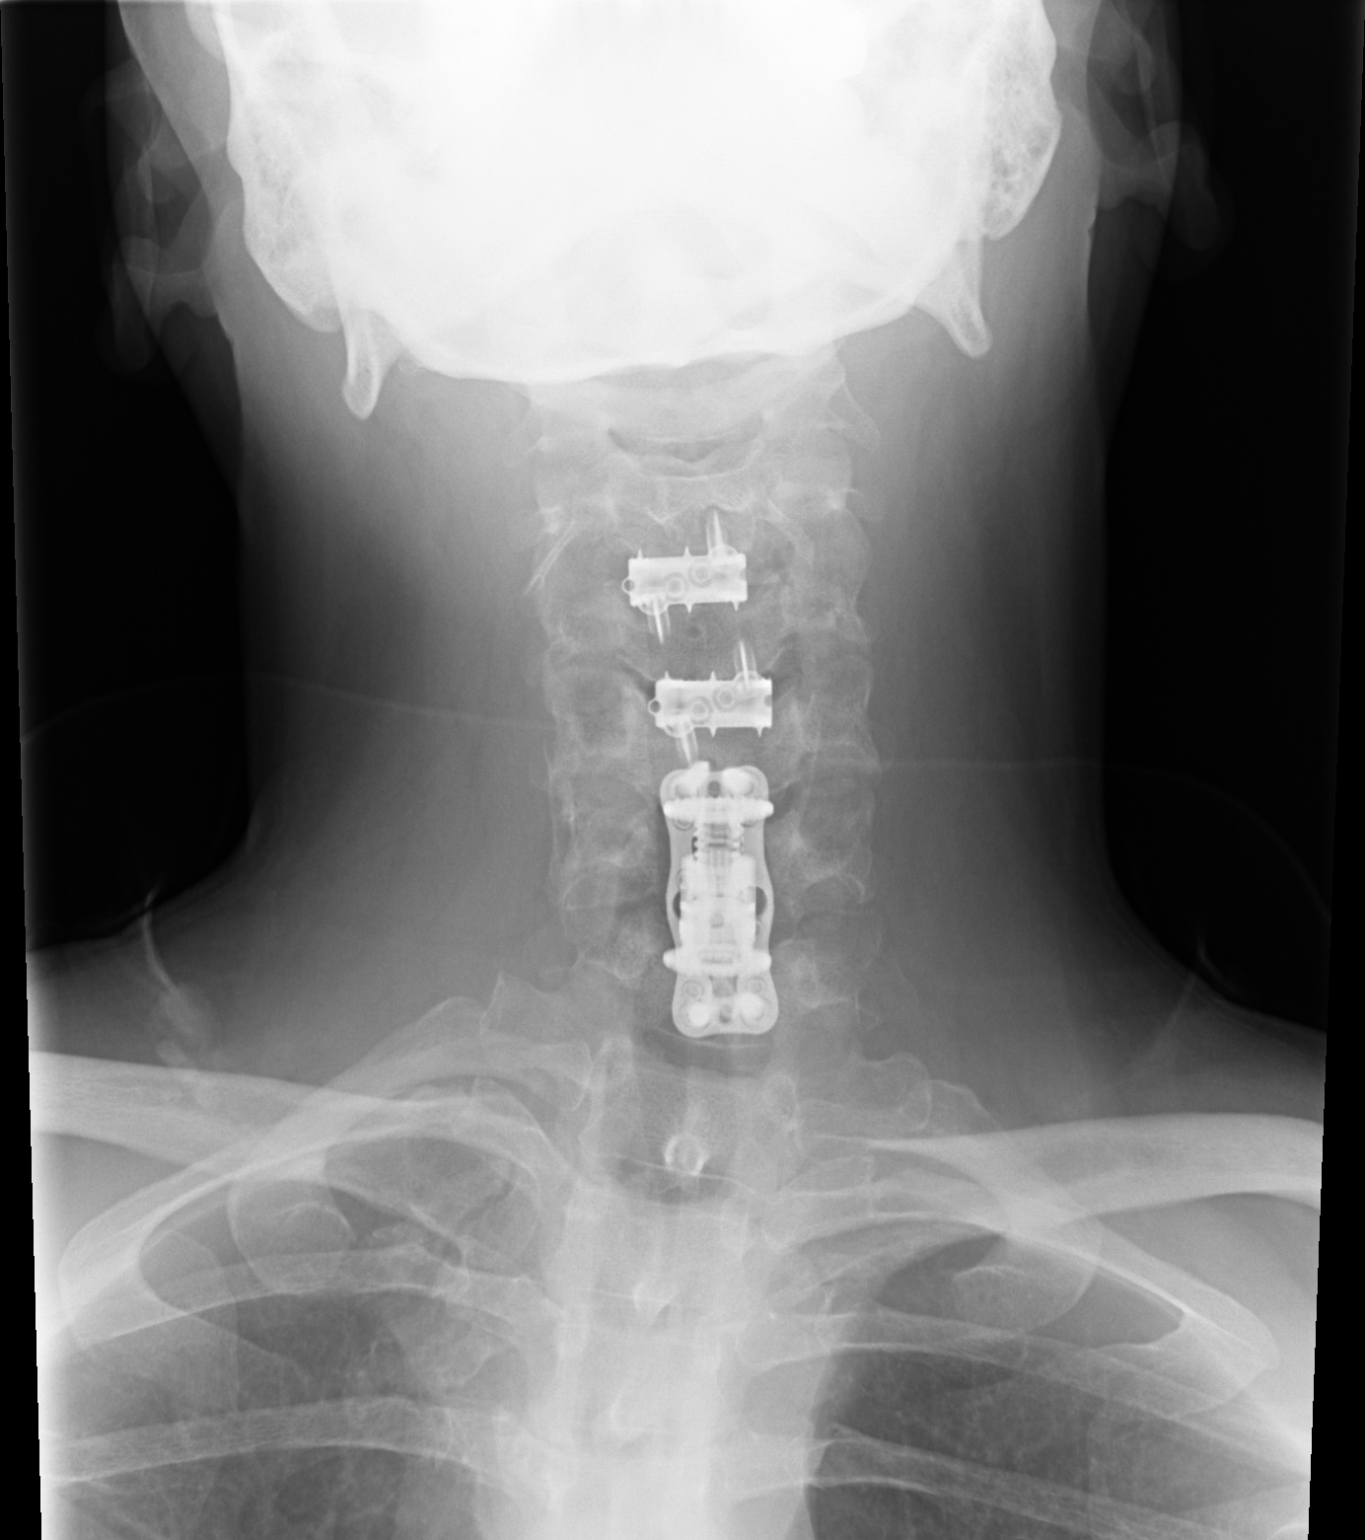

[2 of 2 positions shown; findings below may reference images not displayed]

Epiglottis is normal in size. Subglottic trachea appears unremarkable. There is previous multilevel cervical spine surgery.
IMPRESSION: No soft tissue abnormality. No pharyngeal foreign body.

## 2021-11-17 IMAGING — CT CT SOFT TISSUE NECK WITHOUT CONTRAST
1 of 6 series · 3 of 14 positions shown, 4 images · non-contrast
Comparison: None available.

COUGH, HOARSENESS, DIFFICULTY SWALLOWING,
THROAT/CERVICAL SURGERY [DATE]
FINAL REPORT:
CT SOFT TISSUE NECK WITHOUT CONTRAST
INDICATION: Chronic cough
TECHNIQUE: CT of the neck was performed without contrast. Coronal and sagittal reformatted images were generated. All CT scans at this facility use dose modulation and/or weight based dosing when appropriate to reduce radiation dose to as low as reasonably achievable.

[Series 2: routine chest · axial · 0.85mm/px · z∈[-377,+1]mm · 3 of 152 slices shown, 4 images]
[im 1/152  soft-tissue]
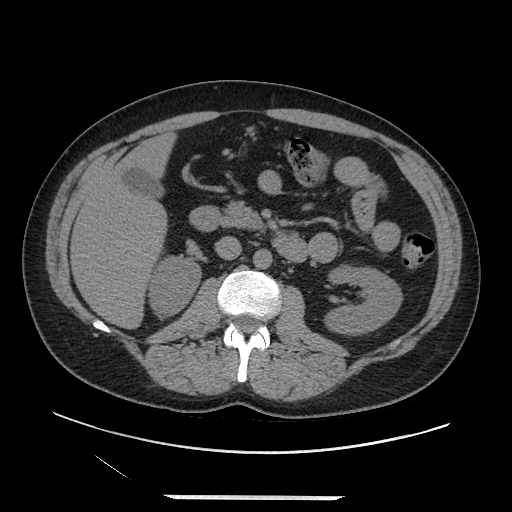
[im 1/152  bone]
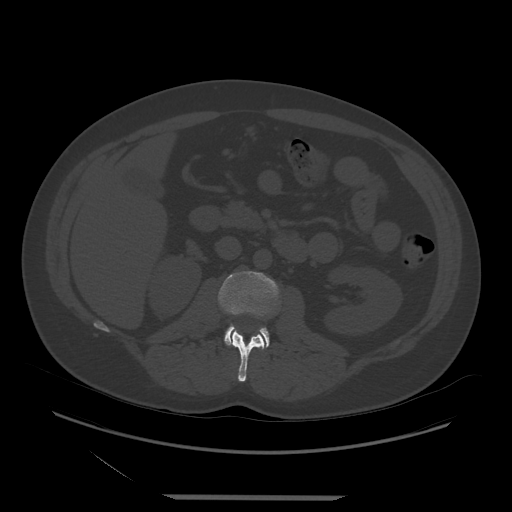
[im 76/152  bone]
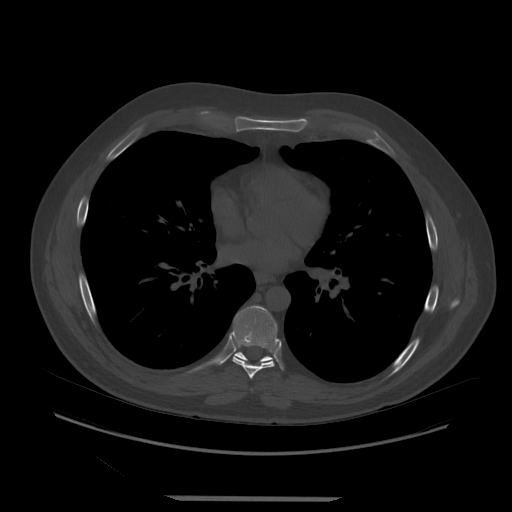
[im 152/152  bone]
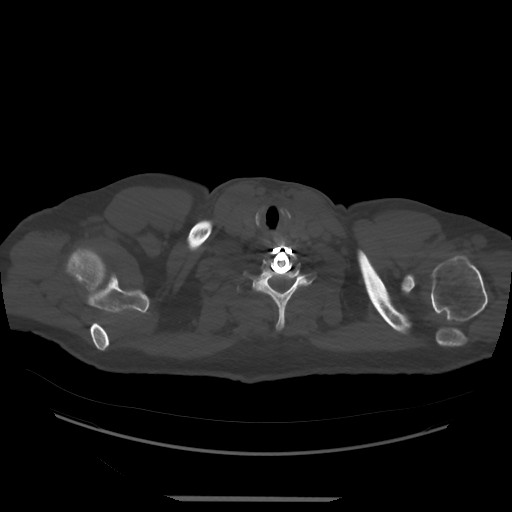

[3 of 14 positions shown; findings below may reference images not displayed]

FINDINGS: Intracranial contents: Normal.
Orbits: Visualized intraorbital contents appear normal.
Sinuses and mastoids: Mild thickening of the nasal septal soft tissues with near occlusion of the left nasal passage anteriorly. Paranasal sinuses appear clear. No significant mastoid fluid.
Salivary glands: Normal.
Thyroid gland: Normal.
Aerodigestive tract: Mild diffuse thickening of the tracheal wall. There are some associated patchy calcifications. The posterior portion does not appear spared. Otherwise, symmetric without significant abnormality evident.
Lungs/airway: Clear.
Upper mediastinum: Visualized portions are within normal limits.
Vasculature: Patent.
Lymph nodes: No significant adenopathy.
Parapharyngeal soft tissues: Fat planes appear preserved.
Thyroid and cricoid cartilage: Intact.
Bones: C3-C7 ACDF with C3-C6 laminectomy. No evidence of heart failure.
IMPRESSION: 
IMPRESSION: 1.  Mild thickening of the nasal septal soft tissues with near occlusion of the left nasal passage anteriorly. Correlate for rhinitis.
2.  Mild diffuse thickening of the tracheal wall which could be related to chronic inflammation. There is a broad differential including postintubation stenosis, amyloidosis, inflammatory bowel disease, and granulomatosis with polyangiitis. Given the apparent involvement of the posterior membrane relapsing polychondritis and tracheobronchopathia osteochondroplastica are considered less likely.

## 2021-11-29 IMAGING — RF FL ESOPHAGUS BARIUM SWALLOW WITH VIDEO AND SPEECH
15 of 22 series · 15 of 24 positions shown · non-contrast
Comparison: None

Air kerma 8.81 mgy, 1.02 min fluoro time,  14 images
FINAL REPORT:
Modified barium swallow
INDICATION: Pneumonitis due to inhalation of food and vomit
TECHNIQUE: Multiple consistencies of barium were administered by mouth by speech pathologist.

[Series 1: esophagus dig. autograb · 0.25mm/px · 1 of 78 frames shown (1 of 15)]
[frame 12/78]
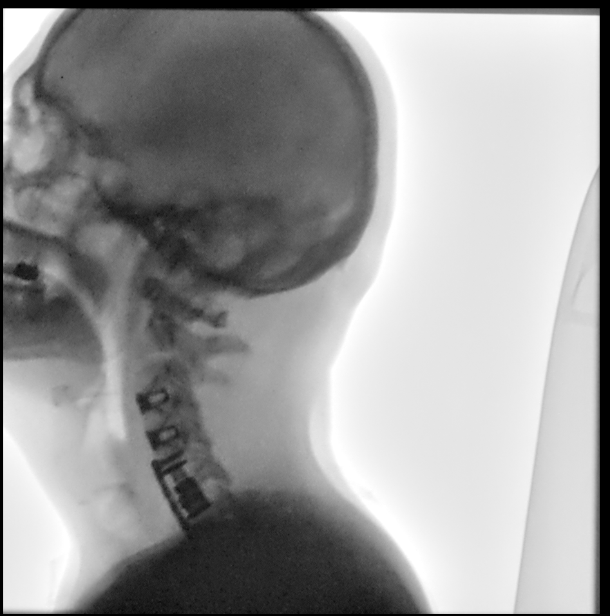

[Series 3: esophagus dig. autograb · 0.25mm/px · 1 of 112 frames shown (2 of 15)]
[frame 2/112]
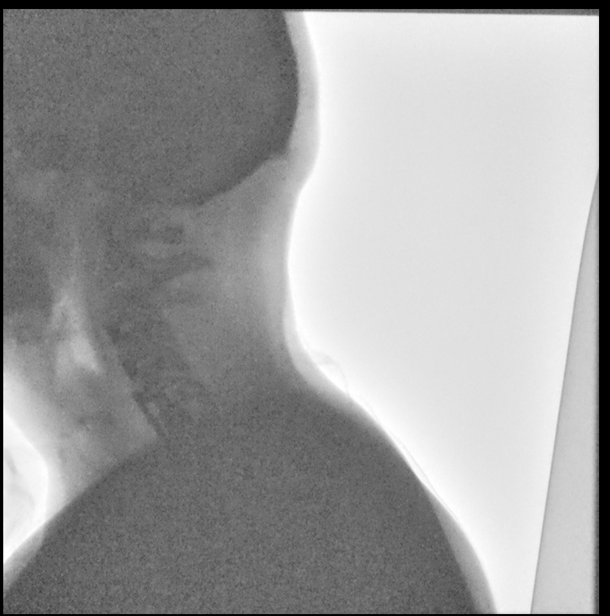

[Series 5: esophagus dig. autograb · 0.25mm/px · 1 of 192 frames shown (3 of 15)]
[frame 29/192]
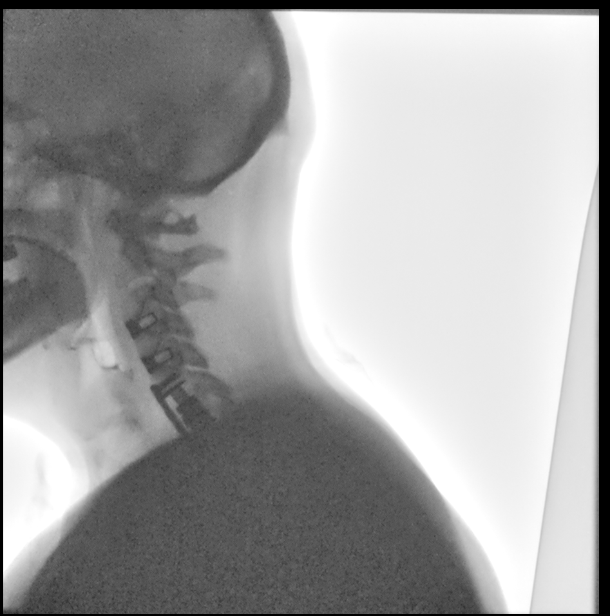

[Series 6: esophagus dig. autograb · 0.25mm/px · 1 of 128 frames shown (4 of 15)]
[frame 3/128]
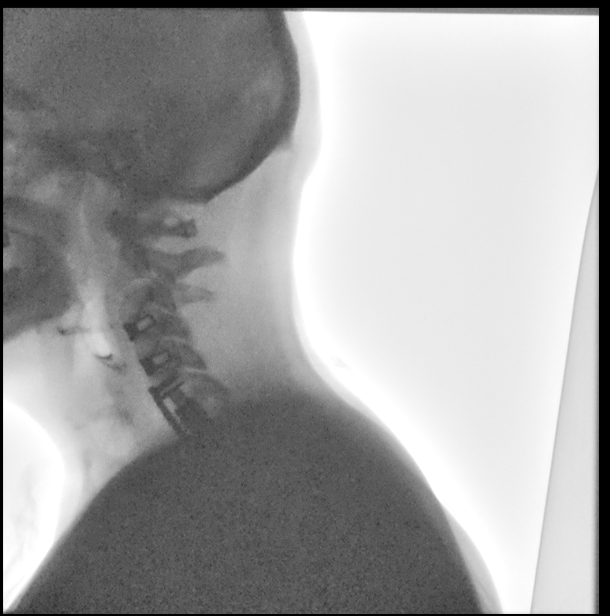

[Series 9: esophagus dig. autograb · 0.25mm/px · 1 of 73 frames shown (5 of 15)]
[frame 11/73]
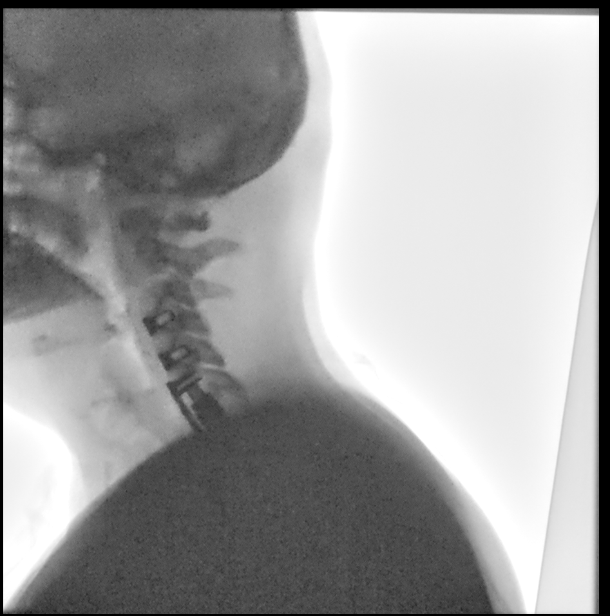

[Series 11: esophagus dig. autograb · 0.25mm/px · 1 of 109 frames shown (6 of 15)]
[frame 17/109]
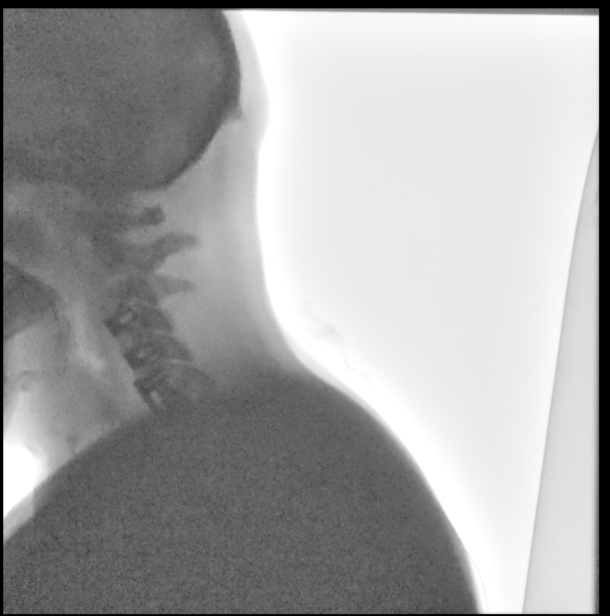

[Series 12: esophagus dig. autograb · 0.25mm/px · 1 of 27 frames shown (7 of 15)]
[frame 23/27]
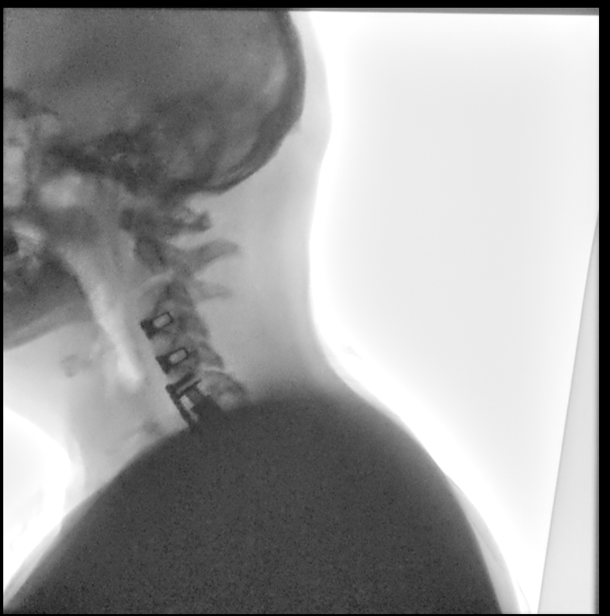

[Series 14: esophagus dig. autograb · 0.25mm/px · 1 of 33 frames shown (8 of 15)]
[frame 29/33]
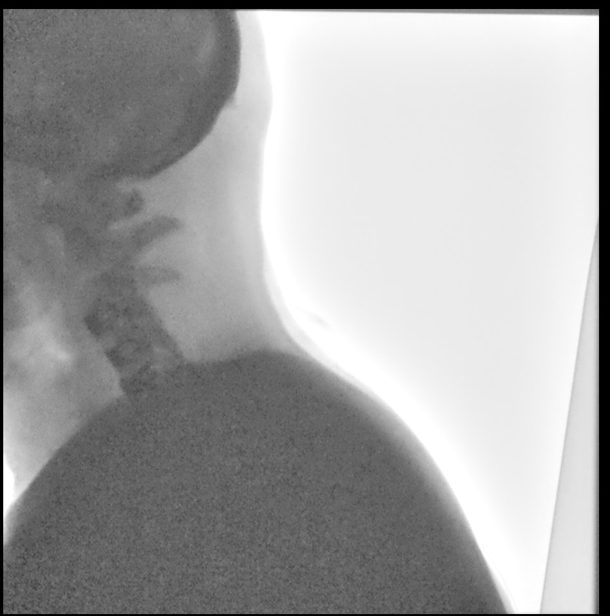

[Series 15: esophagus dig. autograb · 0.25mm/px · 1 of 26 frames shown (9 of 15)]
[frame 23/26]
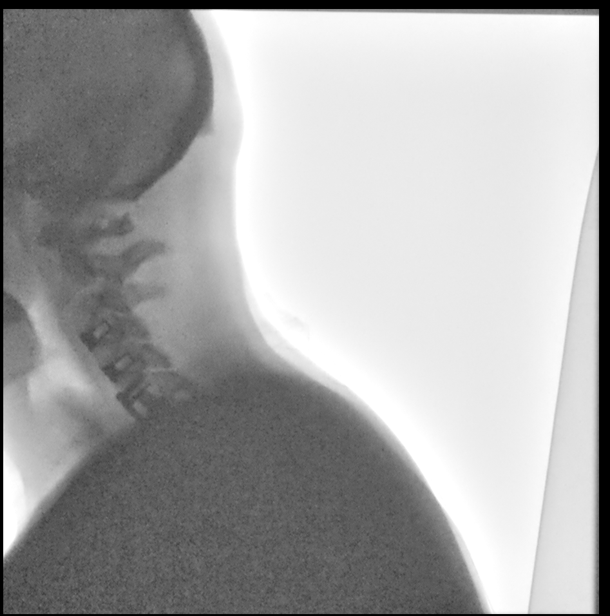

[Series 17: esophagus dig. autograb · 0.25mm/px · 1 of 32 frames shown (10 of 15)]
[frame 28/32]
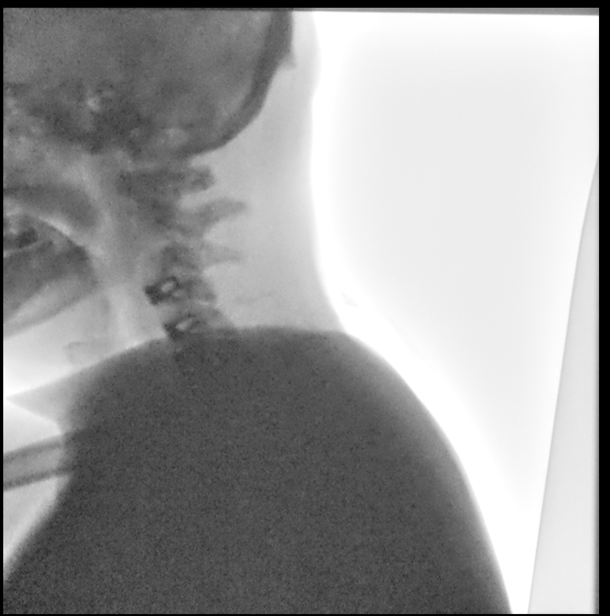

[Series 18: esophagus dig. autograb · 0.25mm/px · 1 of 22 frames shown (11 of 15)]
[frame 12/22]
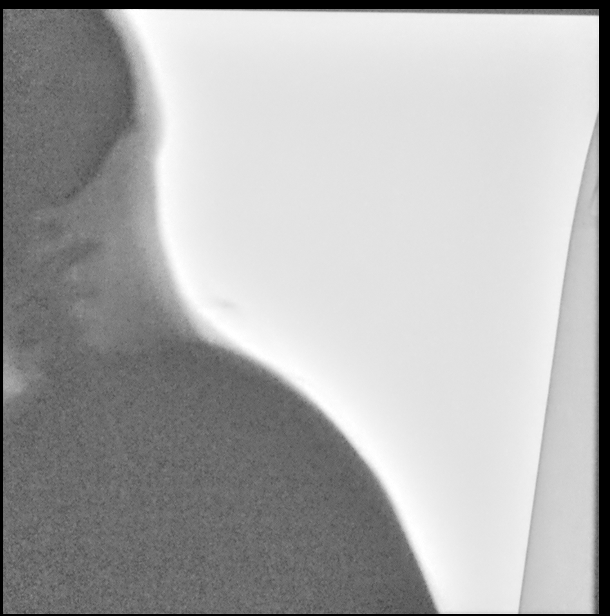

[Series 20: esophagus dig. autograb · 0.25mm/px · 1 of 29 frames shown (12 of 15)]
[frame 15/29]
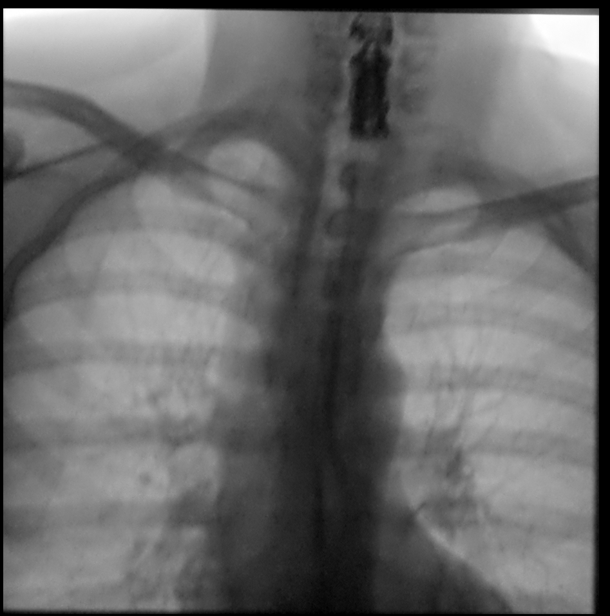

[Series 23: esophagus dig. autograb · 0.25mm/px · 1 of 48 frames shown (13 of 15)]
[frame 25/48]
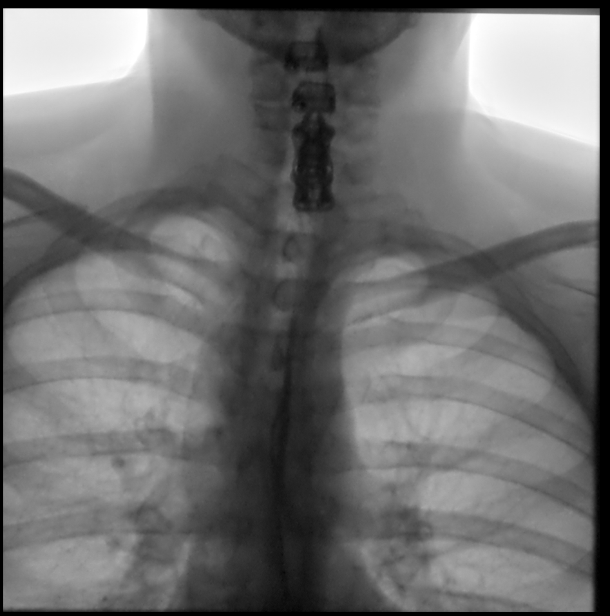

[Series 24: esophagus dig. autograb · 0.25mm/px · 1 of 26 frames shown (14 of 15)]
[frame 1/26]
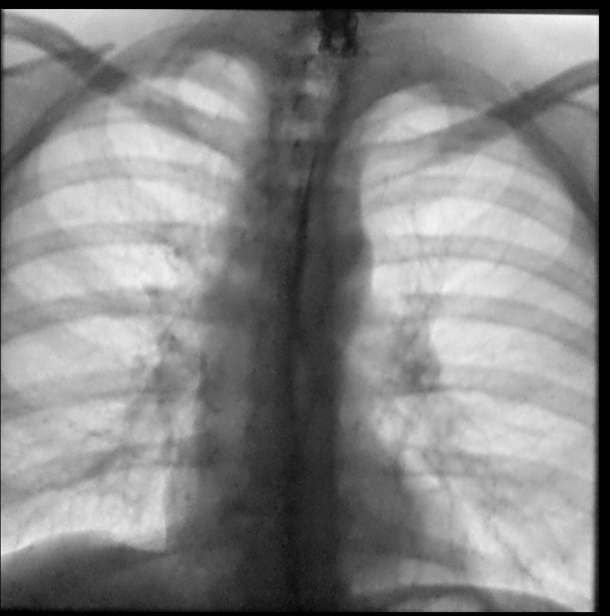

[Series 26: esophagus dig. autograb · 0.08mm/px · 1 of 1 slices shown (15 of 15)]
[im 1/1]
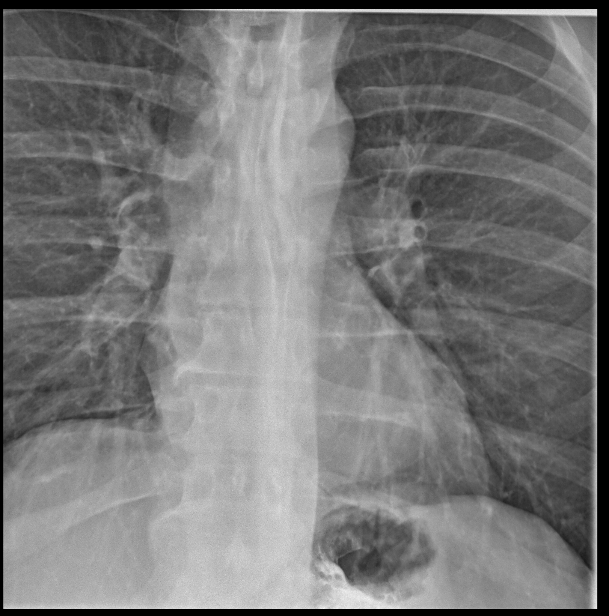

[15 of 24 positions shown; findings below may reference images not displayed]

FINDINGS: Cervical fusion hardware and corpectomy changes noted.  Fluoroscopic time 1.0 minutes. Reference air kerma: 8.8 mgy.
IMPRESSION: Please see speech pathology note.

## 2022-02-01 IMAGING — US US ABDOMEN RUQ
1 series · 14 of 25 positions shown · non-contrast
Comparison: None

FINAL REPORT:
EXAM: Limited abdominal ultrasound.
HISTORY: Right upper quadrant pain
TECHNIQUE: Grayscale, color Doppler, and spectral Doppler sonography was performed of the abdomen.

[Series 1: us abdomen ruq · 14 of 91 slices shown]
[im 1/91]
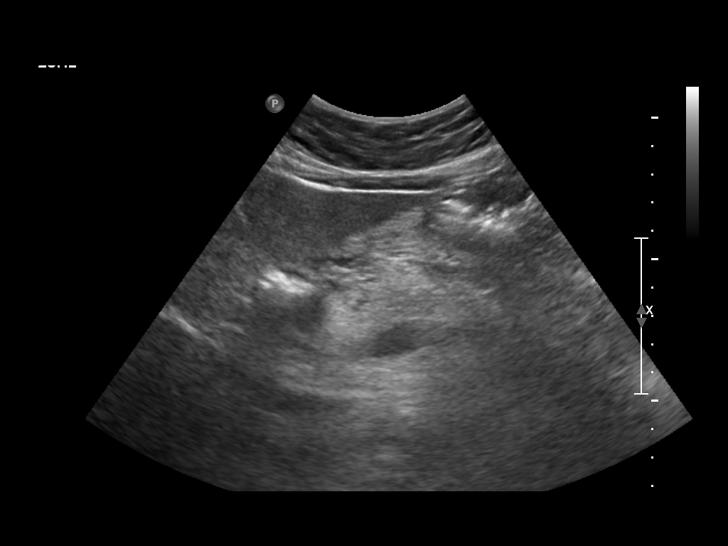
[im 8/91]
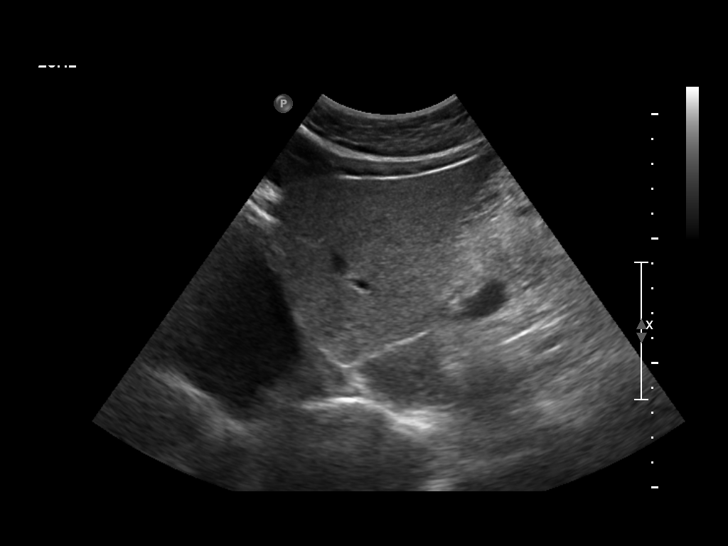
[im 16/91]
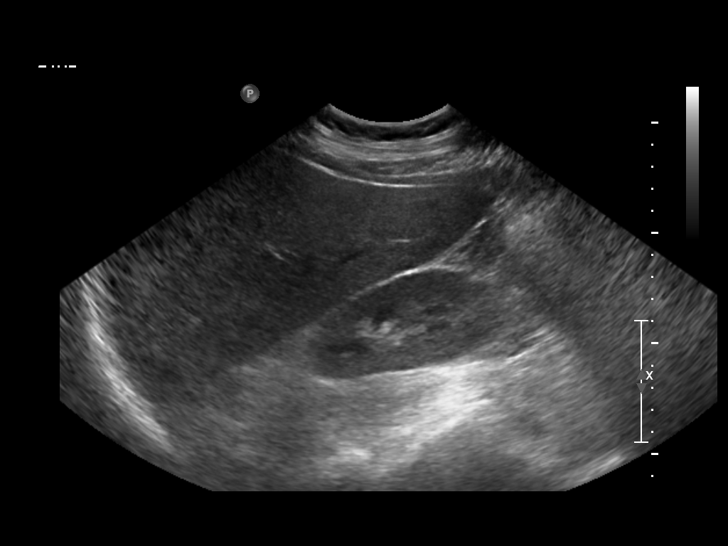
[im 23/91]
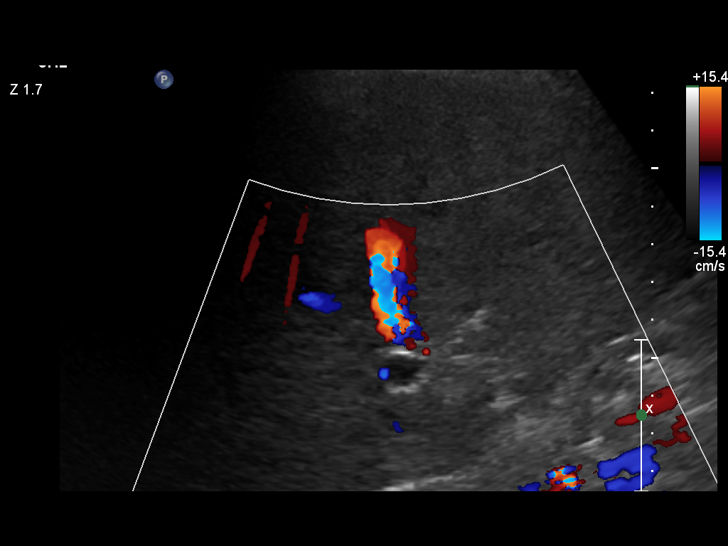
[im 31/91]
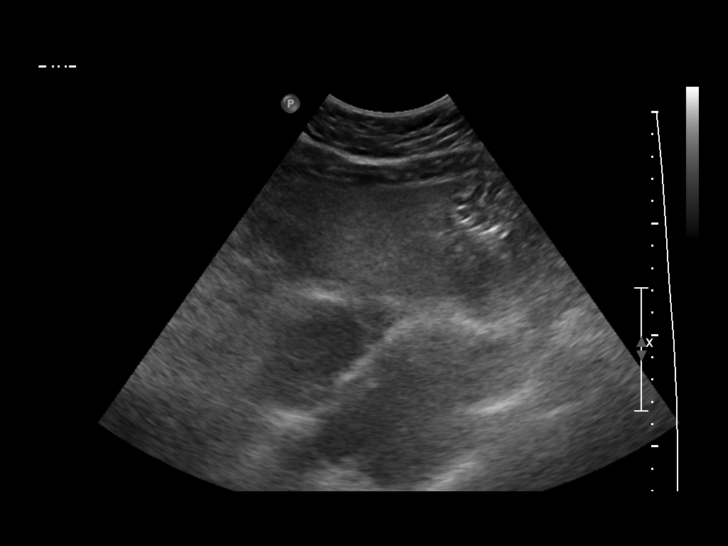
[im 34/91]
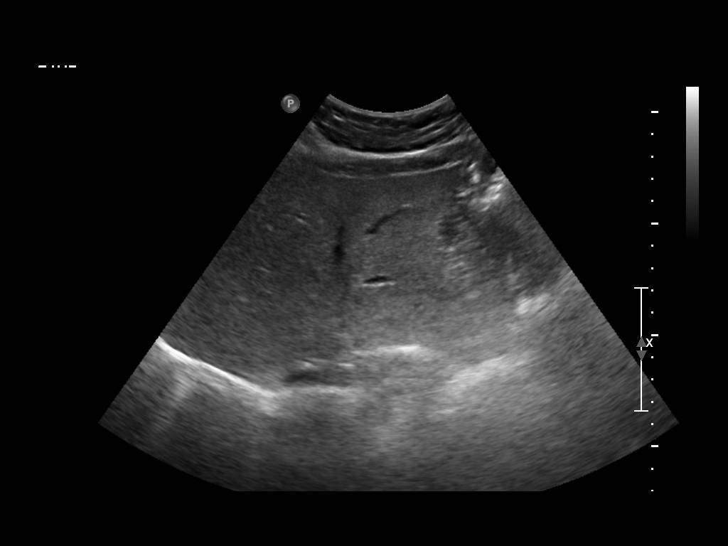
[im 42/91]
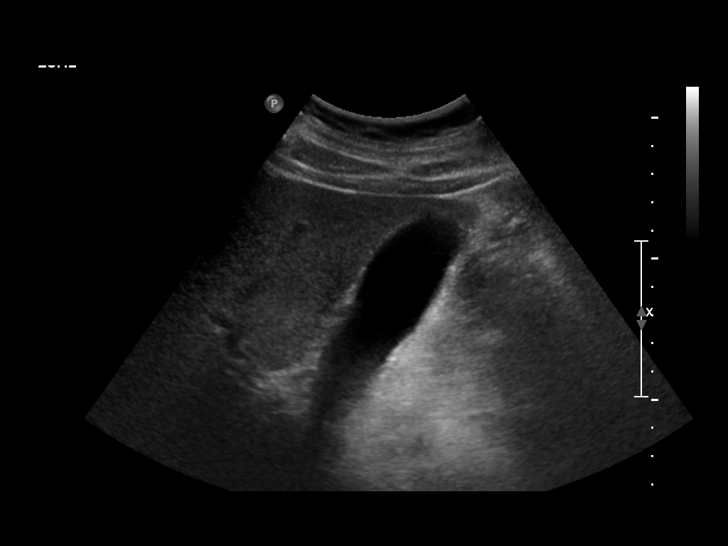
[im 49/91]
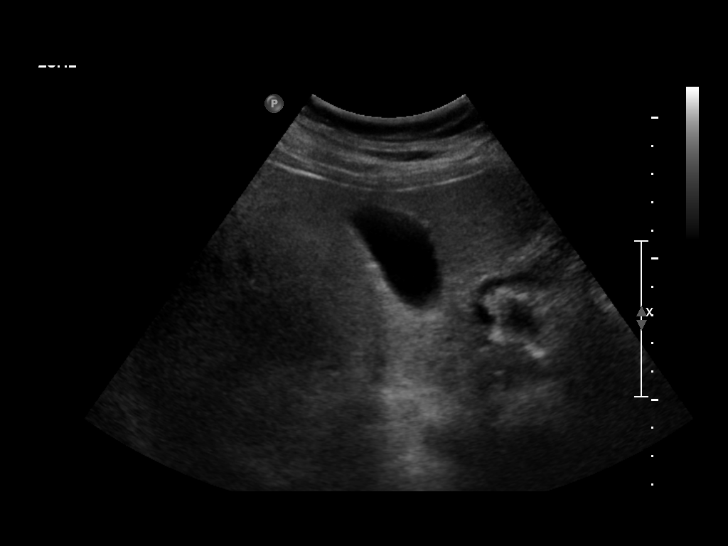
[im 57/91]
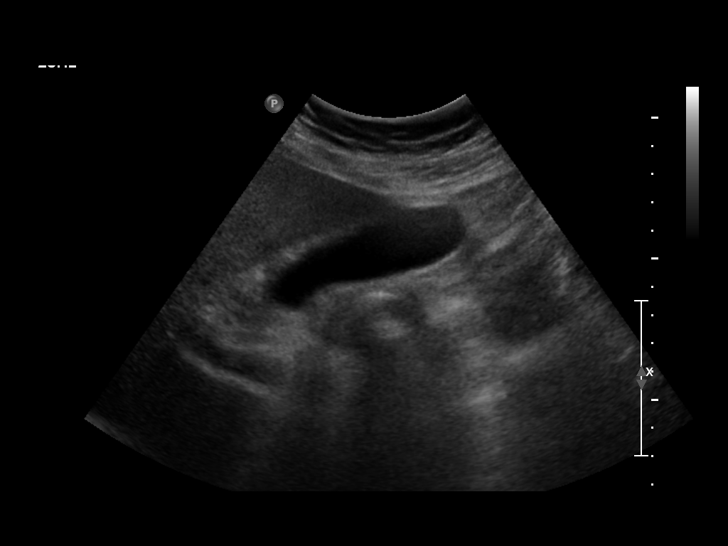
[im 61/91]
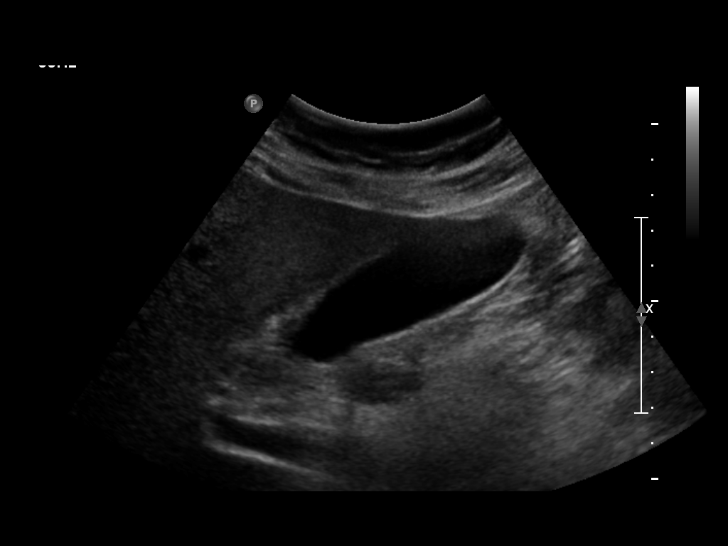
[im 68/91]
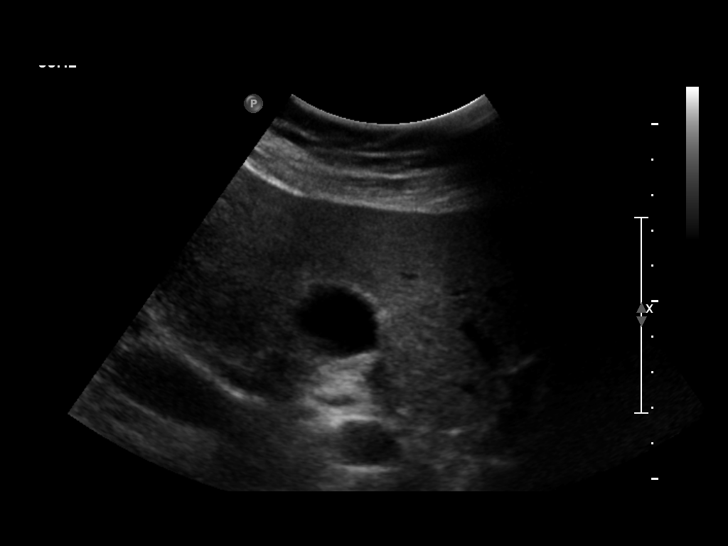
[im 76/91]
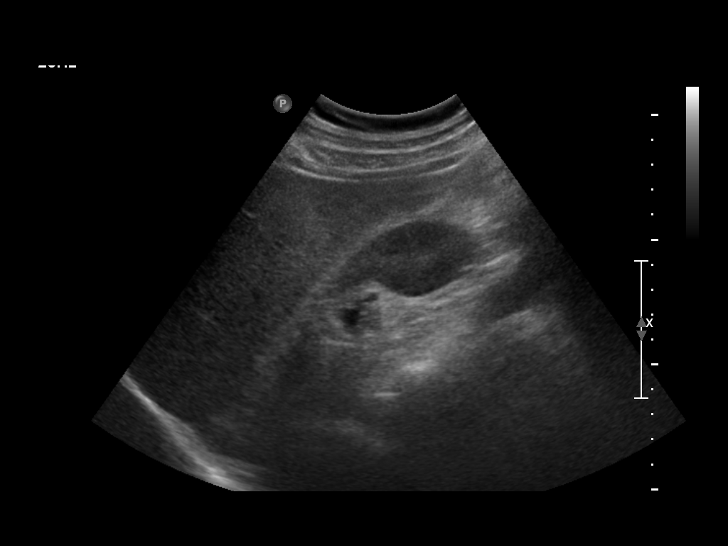
[im 83/91]
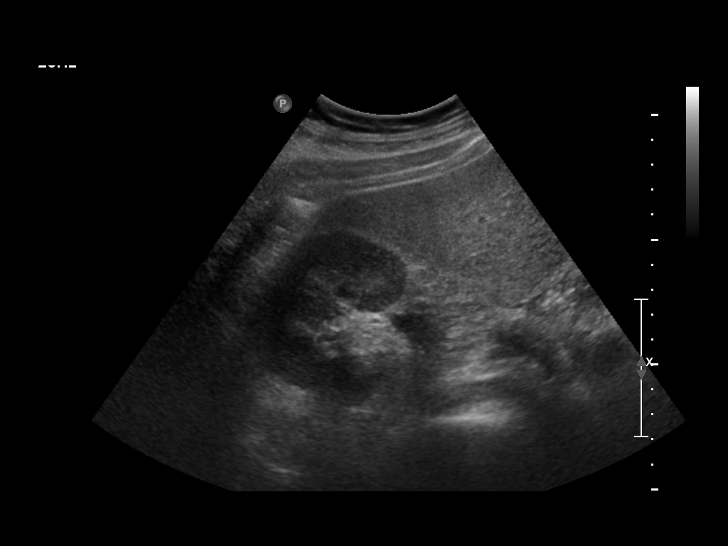
[im 91/91]
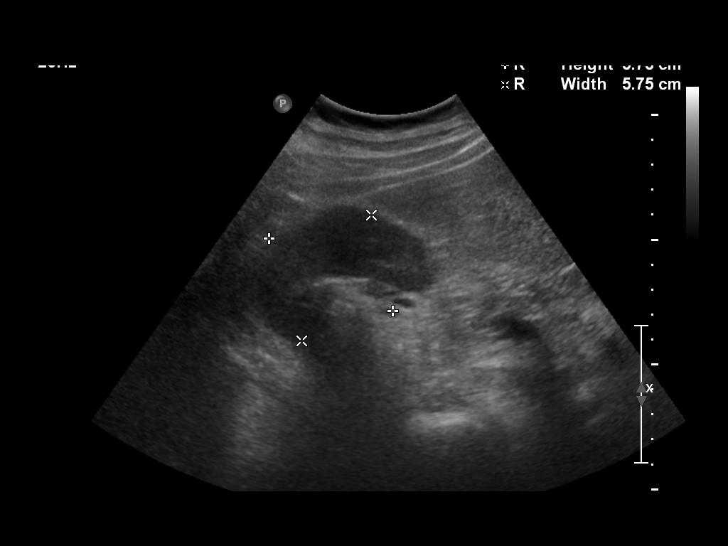

[14 of 25 positions shown; findings below may reference images not displayed]

FINDINGS: The pancreas is normal in the visualized portions..
The liver demonstrates slightly increased echogenicity. The liver measures 18.8 centimeters. No focal hepatic lesions are identified. The portal vein is patent and demonstrates hepatopedal flow. There is no evidence for intrahepatic or extrahepatic biliary ductal dilatation.
The common bile duct measures 3 mm in transverse diameter.
No biliary sludge/stones. The gallbladder wall measures 3 mm, and there is a negative sonographic Murphy sign.
The right kidney measures 12.2 x 5.7 x 5.8 cm. There is no hydronephrosis and no stones..
The IVC is normal.
There is no ascites.
IMPRESSION: 1. No acute intra-abdominal process.
2. Slightly increased echogenicity of the liver, compatible with hepatic steatosis.

## 2022-06-08 IMAGING — CT CT CHEST HIGH RESOLUTION
2 of 6 series · 13 of 36 positions shown, 16 images · non-contrast
Comparison: CT chest from 11/17/2021

Cough/shortness of breath x 1yr/CERVICAL SURGERY [DATE]/pna
FINAL REPORT:
CT CHEST HIGH RESOLUTION
TECHNIQUE: CT images through the chest were obtained without intravenous contrast. All CT scans at this facility use dose modulation and/or weight based dosing when appropriate to reduce radiation dose.
CLINICAL INDICATION:  Chronic cough
Shortness of breath.

[Series 2: high res chest · axial · 0.82mm/px · z∈[-336,-31]mm · 12 of 73 slices shown, 15 images]
[im 6/73  mediastinal]
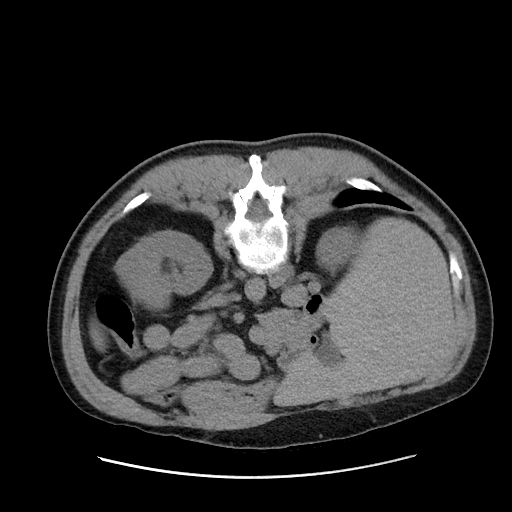
[im 6/73  lung]
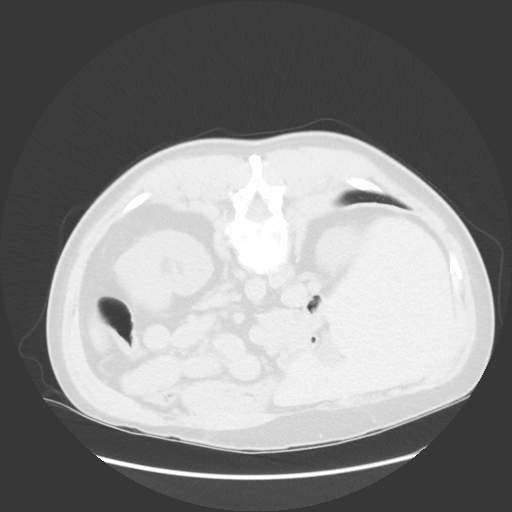
[im 12/73  lung]
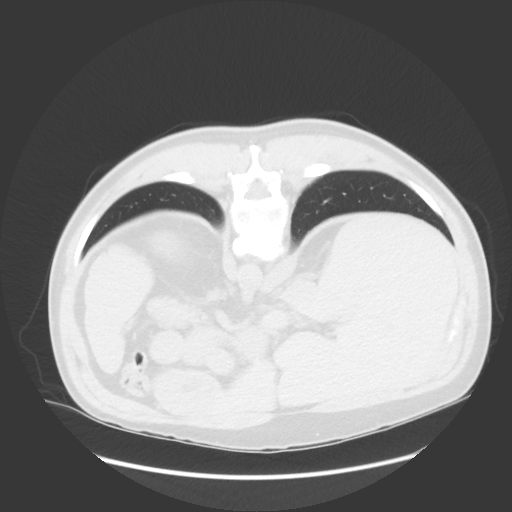
[im 17/73  lung]
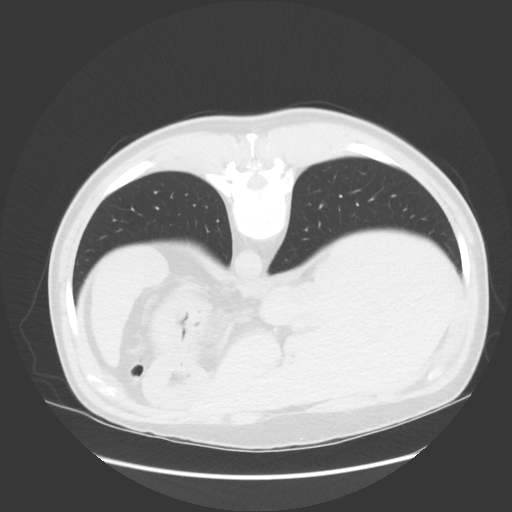
[im 23/73  lung]
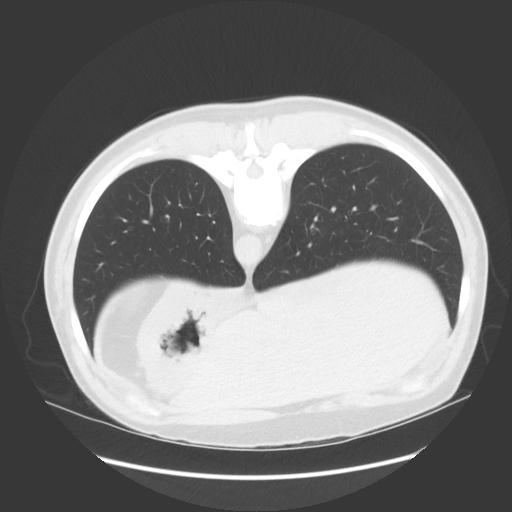
[im 28/73  mediastinal]
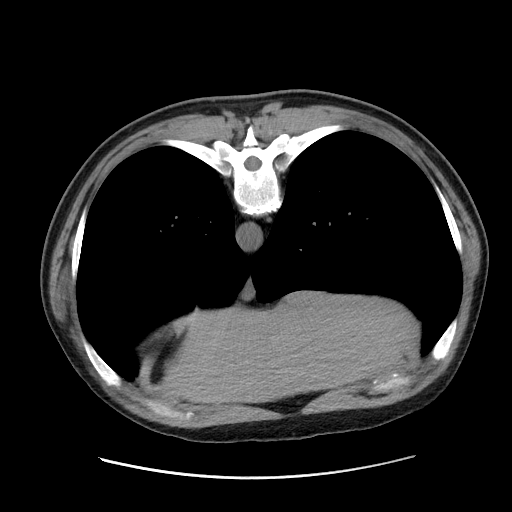
[im 28/73  lung]
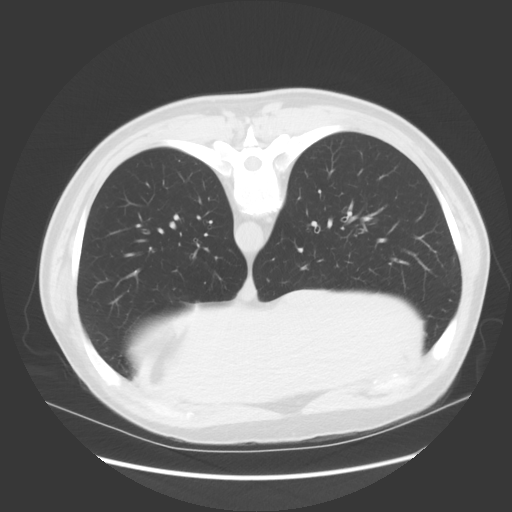
[im 34/73  lung]
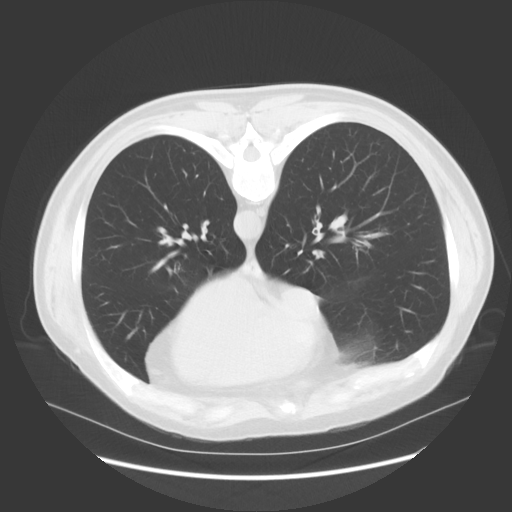
[im 39/73  lung]
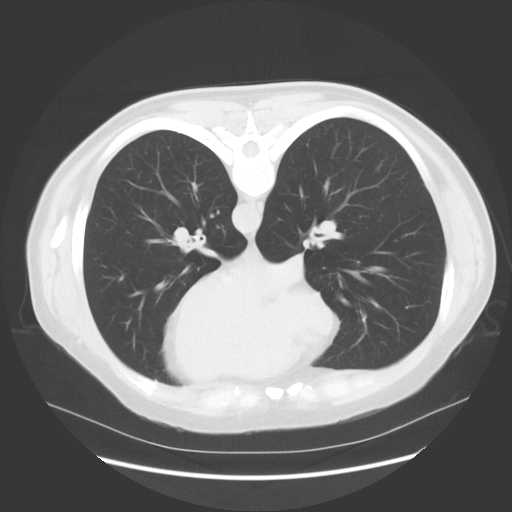
[im 45/73  lung]
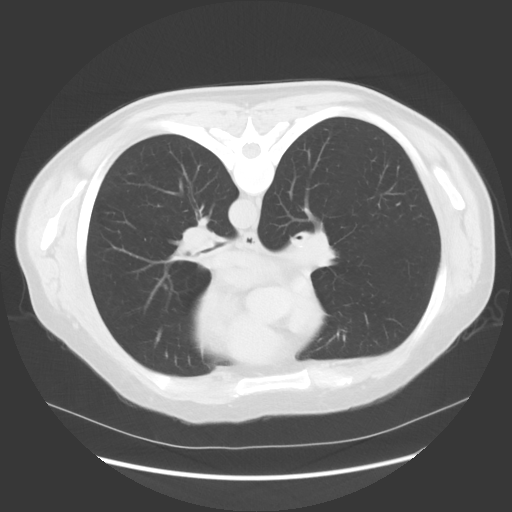
[im 50/73  mediastinal]
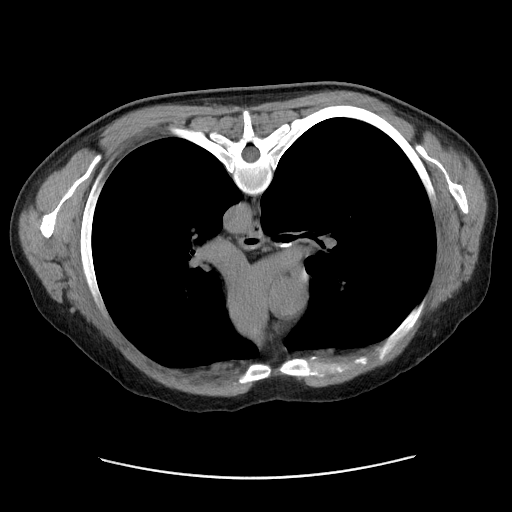
[im 50/73  lung]
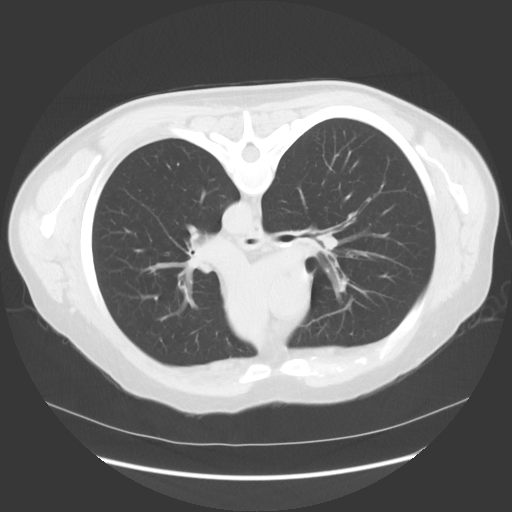
[im 56/73  lung]
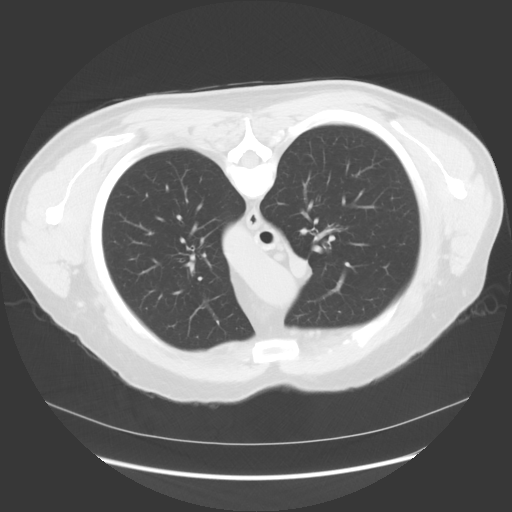
[im 61/73  lung]
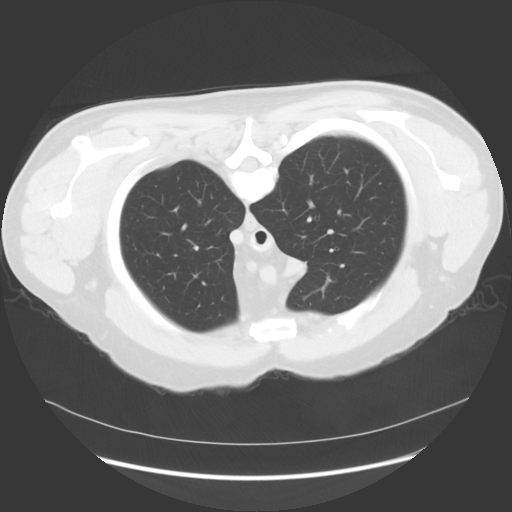
[im 67/73  lung]
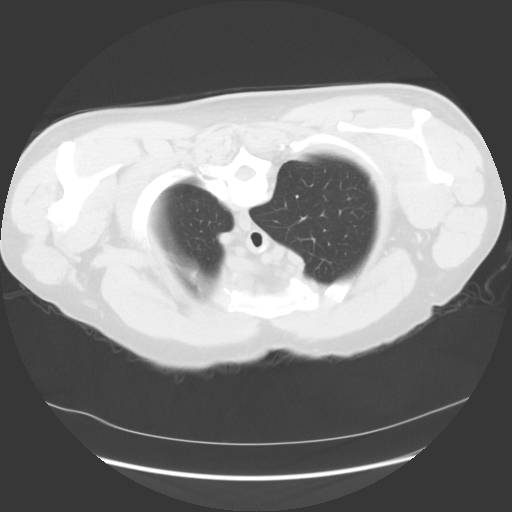

[Series 601: coronal · coronal · 0.82mm/px · 1 of 123 slices shown]
[im 62/123  lung]
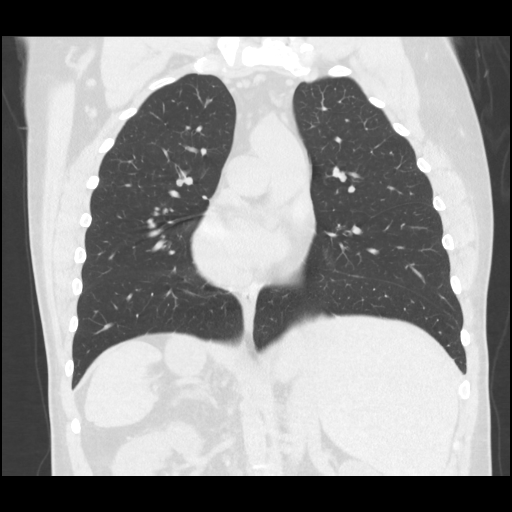

[13 of 36 positions shown; findings below may reference images not displayed]

FINDINGS: Mediastinum: Heart size is normal. No coronary calcifications. Aorta is normal caliber.
Lymph nodes: No pathologically enlarged thoracic lymph nodes.
Lungs/Pleura:
Again seen diffuse abnormal soft tissue thickening of the trachea, and bilateral main and proximal bronchi.
Bilateral bronchial wall thickening. There are areas of secretions/mucous impaction within airways within the bilateral lower lobes, lingula, and right middle lobe.
There is a punctate calcified granuloma in the right upper lobe on series 301 image 86.
Upper abdomen: No acute abnormality.
Bones and soft tissues: No acute osseous abnormality. Cervical fusion hardware.
IMPRESSION: Again seen diffuse abnormal soft tissue thickening of the trachea and bilateral main and proximal bronchi. Differential diagnosis includes autoimmune processes such as relapsing polychondritis versus granulomatous processes such as granulomatosis with polyangiitis, sarcoidosis, as well as other etiologies.
There are prominent areas of secretions/mucus impaction within airways within the bilateral lower lobes, lingula, and right middle lobe. This has worsened from prior CT.
There are no acute airspace opacities.

## 2022-06-13 IMAGING — CT CT CHEST WITHOUT CONTRAST
2 of 4 series · 15 of 36 positions shown, 18 images · non-contrast
Comparison: CT chest June 08, 2022

FINAL REPORT:
INDICATION: possible aspiration
Exam: CT of the chest without contrast
All CT scans at this facility use iterative reconstruction technique, dose modulation and/or weight based dosing when appropriate to reduce radiation dose to as low as reasonably achievable.

[Series 6: chest w/o · axial · non-contrast · 0.88mm/px · z∈[-445,-122]mm · 12 of 153 slices shown, 15 images]
[im 12/153  mediastinal]
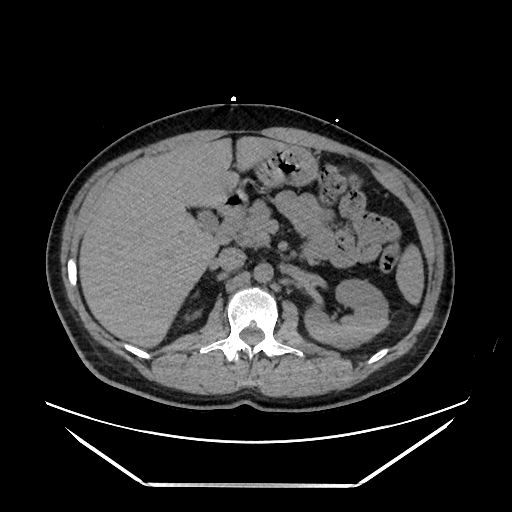
[im 12/153  lung]
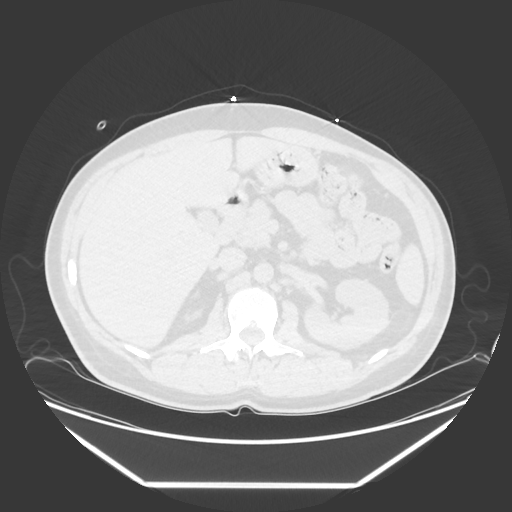
[im 24/153  lung]
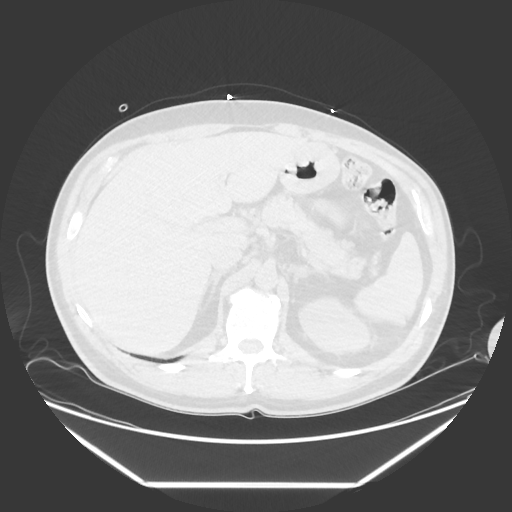
[im 36/153  lung]
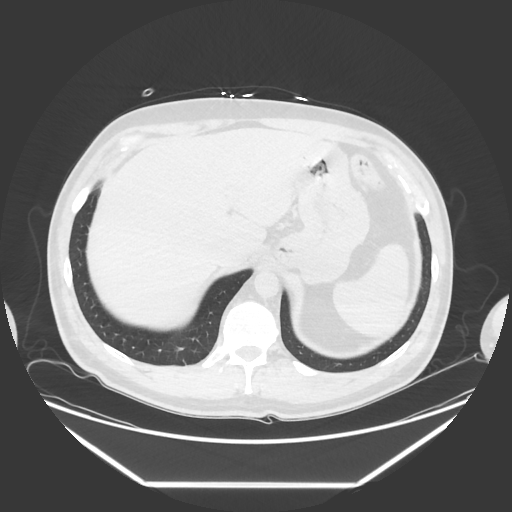
[im 47/153  lung]
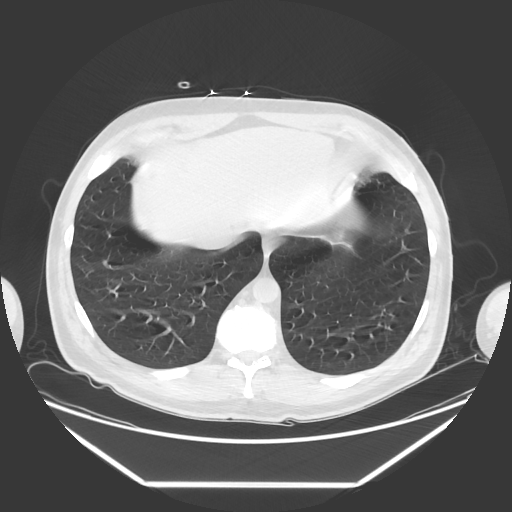
[im 59/153  mediastinal]
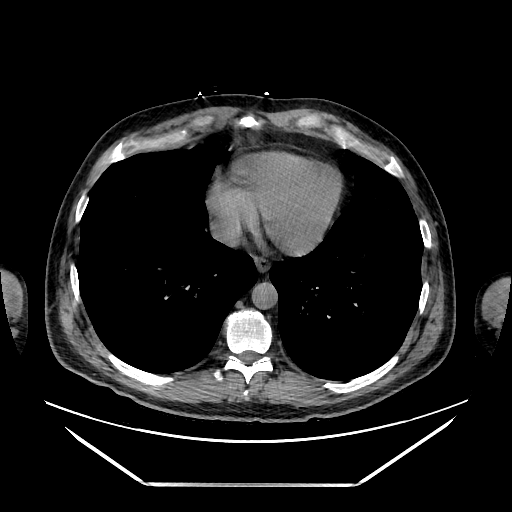
[im 59/153  lung]
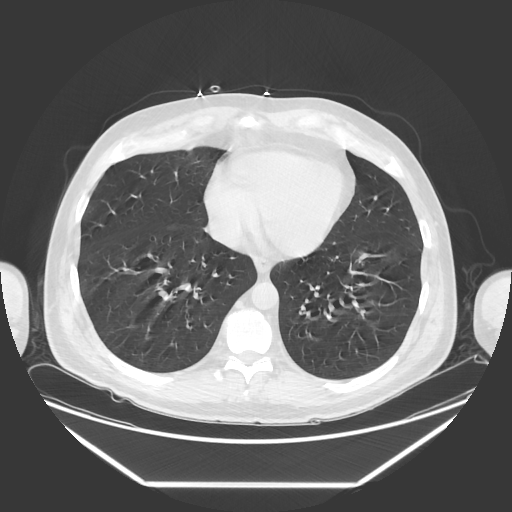
[im 71/153  lung]
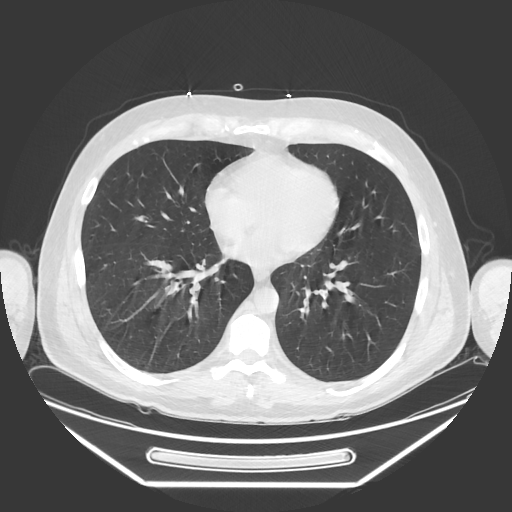
[im 82/153  lung]
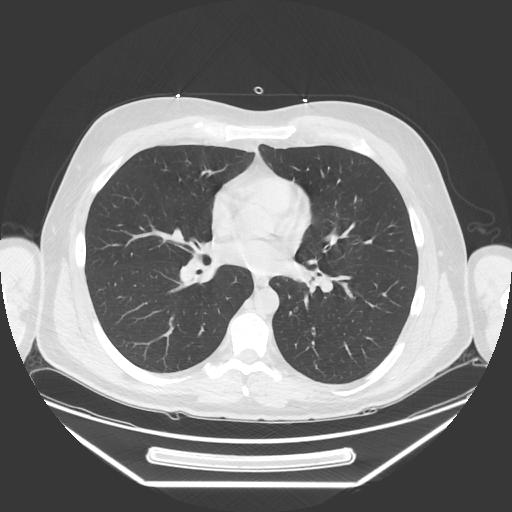
[im 94/153  lung]
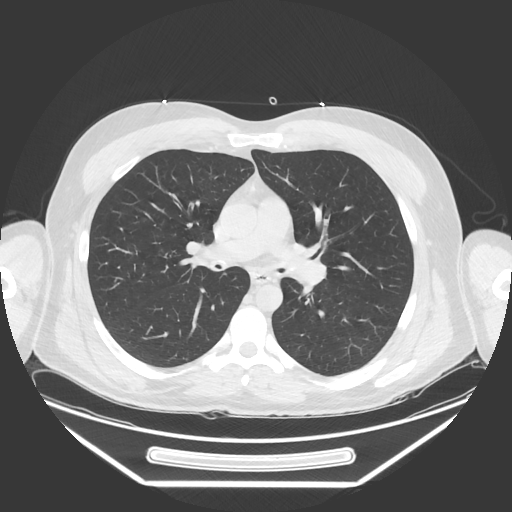
[im 106/153  mediastinal]
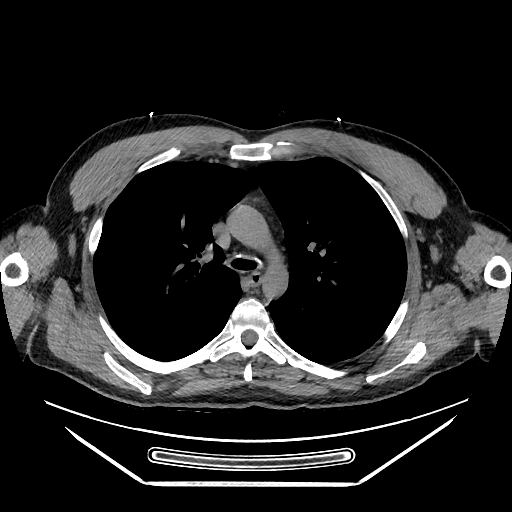
[im 106/153  lung]
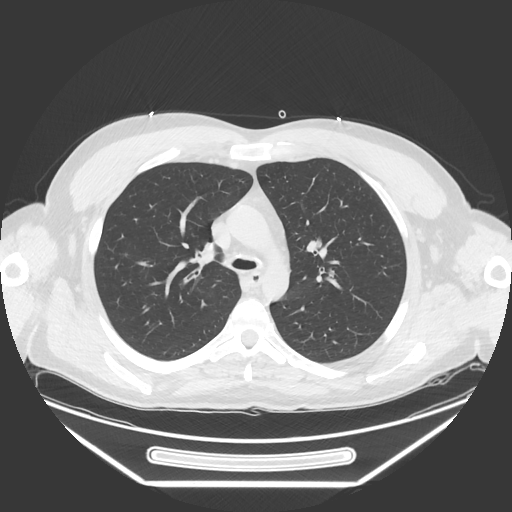
[im 117/153  lung]
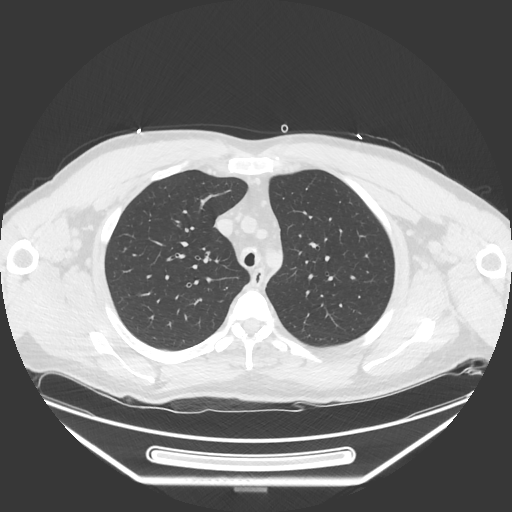
[im 129/153  lung]
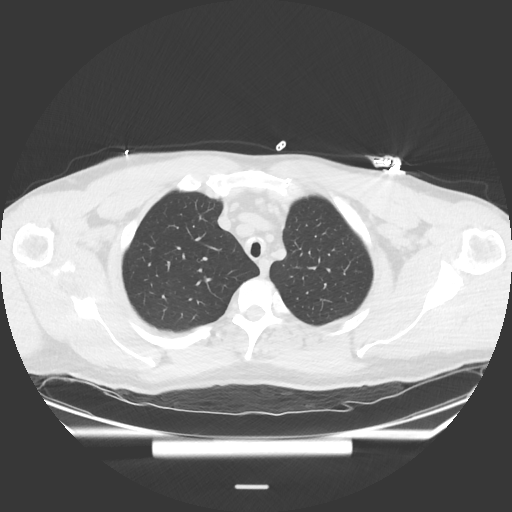
[im 141/153  lung]
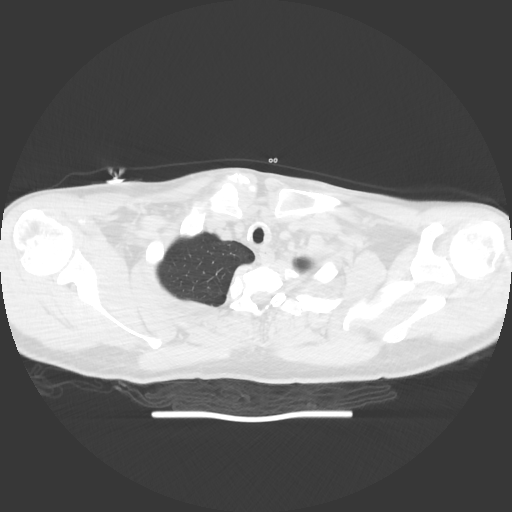

[Series 607: sag standard 2x2 · sagittal · 0.88mm/px · 3 of 227 slices shown]
[im 46/227  lung]
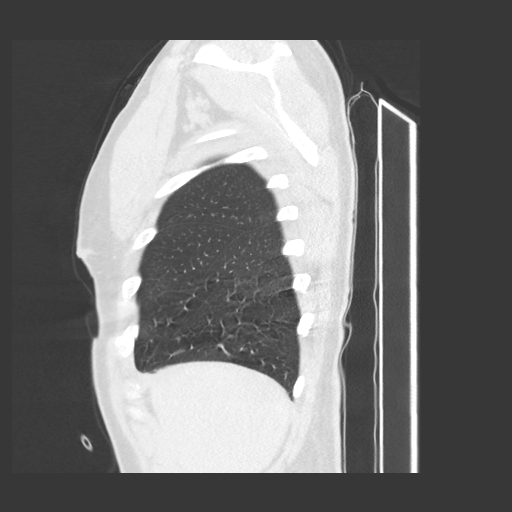
[im 91/227  lung]
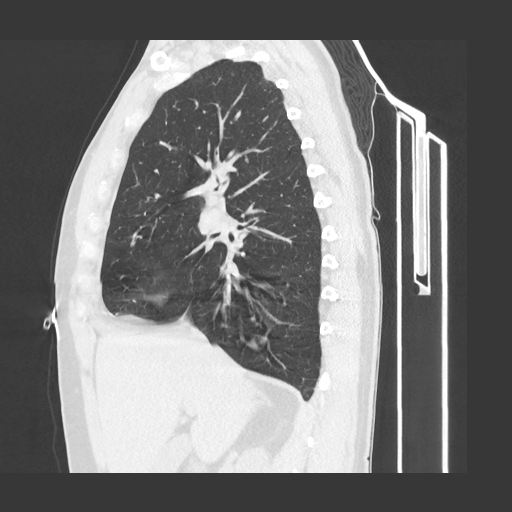
[im 136/227  lung]
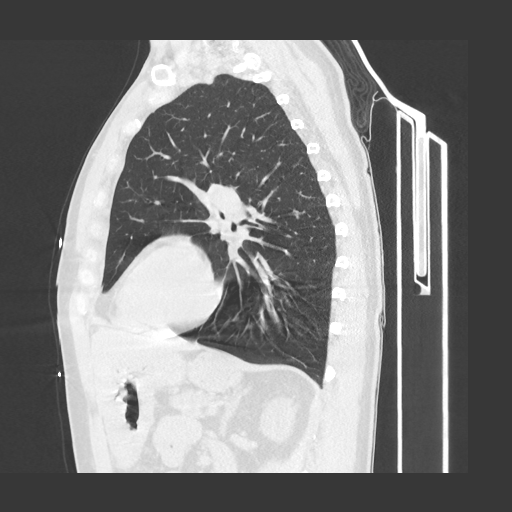

[15 of 36 positions shown; findings below may reference images not displayed]

FINDINGS: Motion-degraded study. Study is limited by artifact from patient's arms being down.
Supraclavicular region: Unremarkable.
Thoracic lymph nodes: Borderline enlarged lymph nodes in the superior mediastinum measuring up to 0.9 x 1.1 cm ([DATE]), unchanged from comparison study. Hilar regions are difficult to value given the lack of intravenous contrast.
Heart and vessels: Heart size is normal. No pericardial effusion. Coronary arterial calcifications are not visualized.
Lungs: Evaluation of the lungs is limited by respiratory motion degradation
Wall thickening of the trachea and bronchi, unchanged from comparison study. Multifocal mucus plugging, unchanged.
Other findings: No acute abnormality in the upper abdomen. No acute fracture, malalignment, or destructive osseous lesion is identified. Degenerative changes of the spine.. Partially visualized cervical fusion hardware.
IMPRESSION: Unremarkable chest CT.
1.  Wall thickening of the trachea and bronchi, unchanged from CT chest June 08, 2022. Differential diagnosis includes autoimmune processes such as relapsing polychondritis versus granulomatous processes such as granulomatosis with polyangiitis, sarcoidosis, as well as other etiologies.
2.  Multifocal mucus plugging, unchanged from June 08, 2022.
3.  Borderline enlarged mediastinal lymph nodes, nonspecific in etiology and unchanged.

## 2022-06-13 IMAGING — CT CT SOFT TISSUE NECK WITHOUT CONTRAST
3 series · 16 of 33 positions shown, 19 images · IV contrast (agent unspecified)
Comparison: None.

FINAL REPORT:
EXAM: CT Neck with Contrast.
HISTORY: possible aspiration
TECHNIQUE: Multiple axial images were acquired from the skull base to the superior mediastinum, after the uneventful administration of intravenous contrast coronal and sagittal reformats are generated.
All CT scans at this facility use dose modulation and/or weight based dosing when appropriate to reduce radiation dose to as low as reasonably achievable.

[Series 4: neck without contrast · axial · non-contrast · 0.64mm/px · z∈[-205,+70]mm · 8 of 131 slices shown, 10 images]
[im 11/131  soft-tissue]
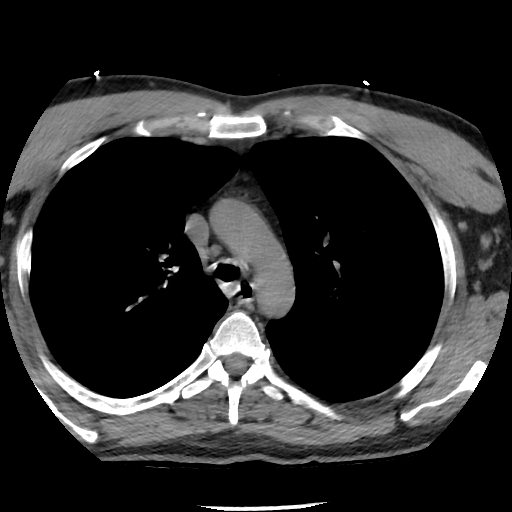
[im 11/131  bone]
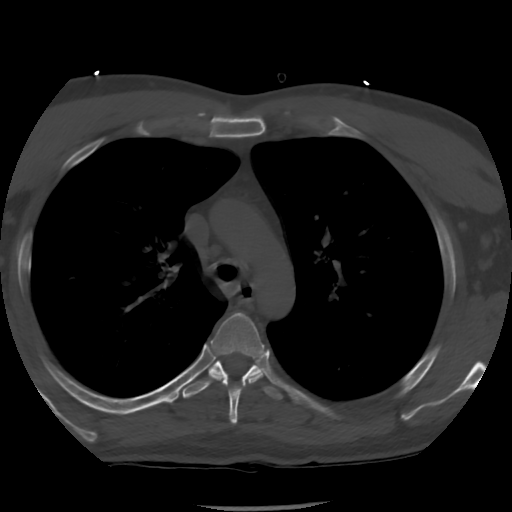
[im 31/131  bone]
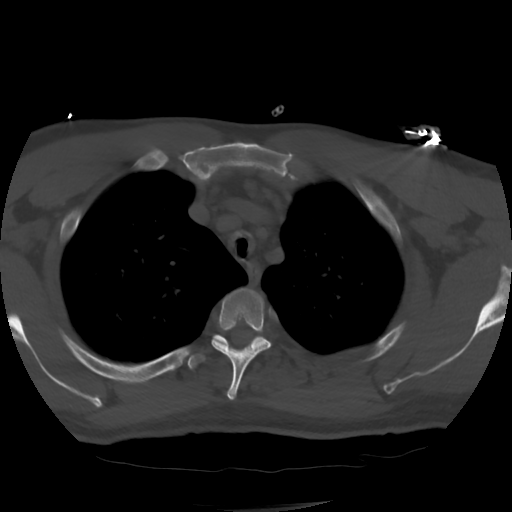
[im 41/131  bone]
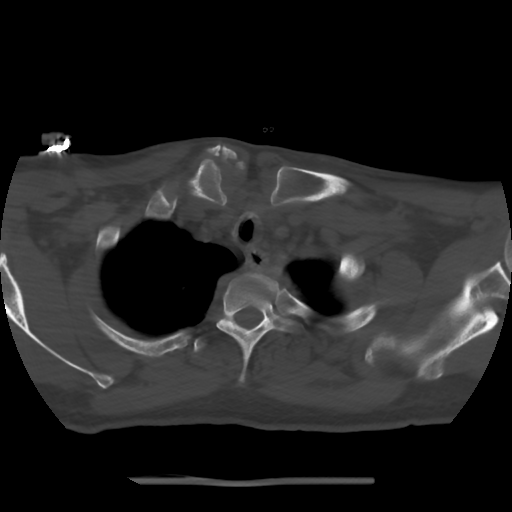
[im 61/131  bone]
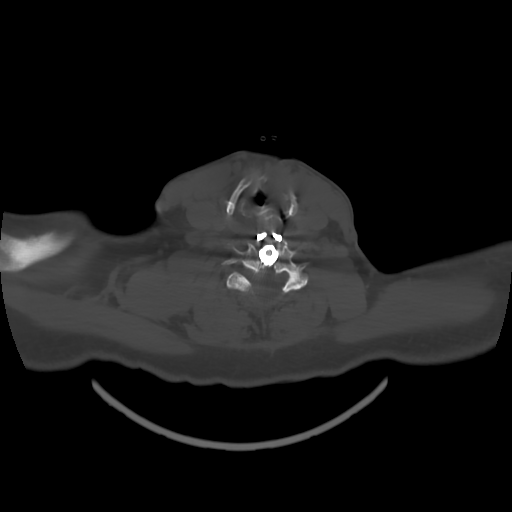
[im 71/131  soft-tissue]
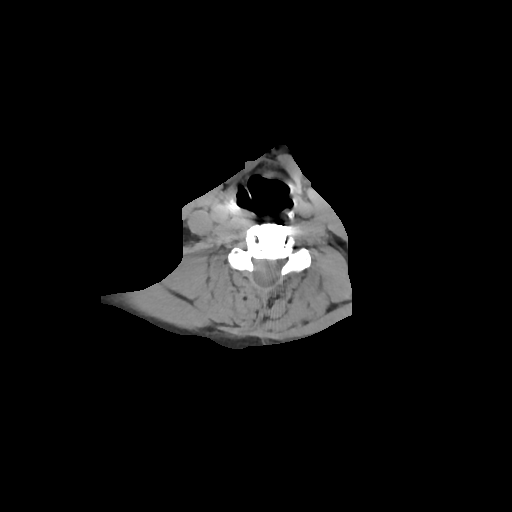
[im 71/131  bone]
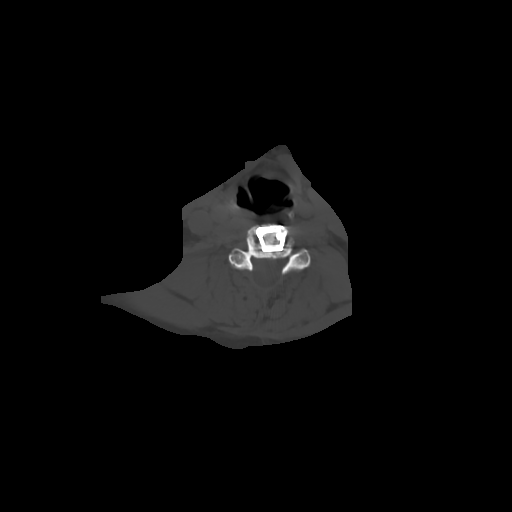
[im 91/131  bone]
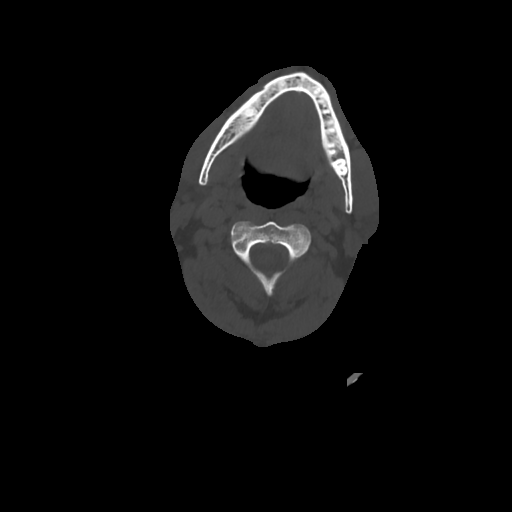
[im 101/131  bone]
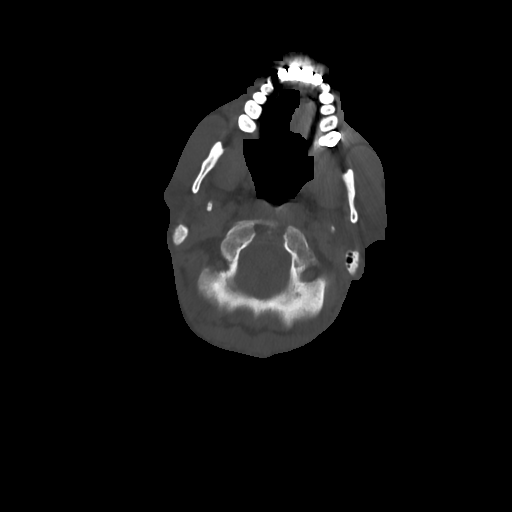
[im 121/131  bone]
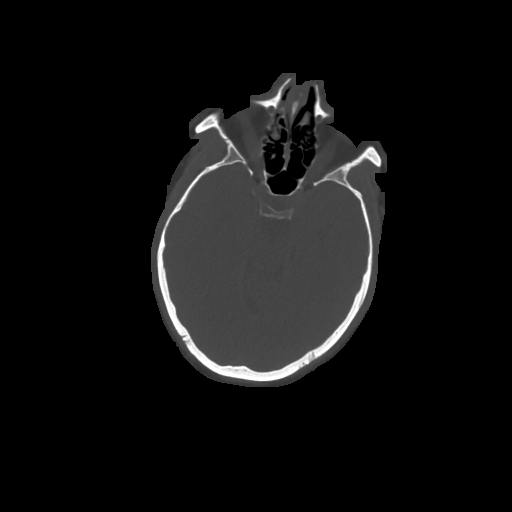

[Series 604: cor standard 2x2 · coronal · 0.64mm/px · 5 of 127 slices shown, 6 images]
[im 43/127  bone]
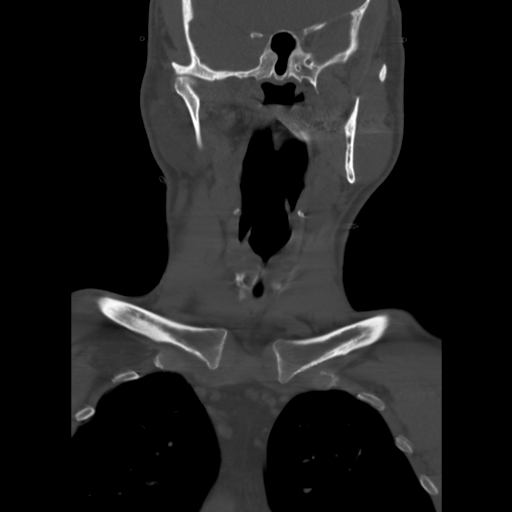
[im 53/127  bone]
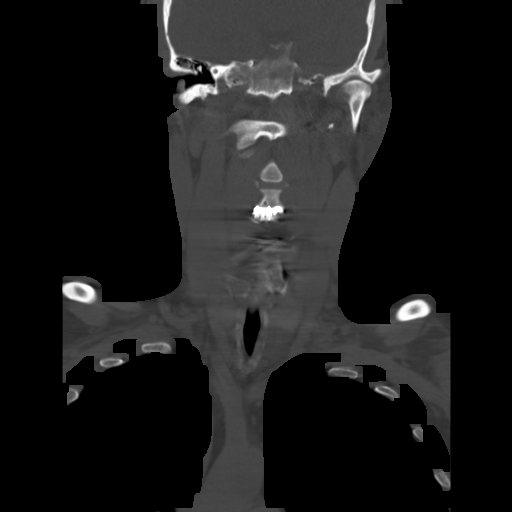
[im 64/127  soft-tissue]
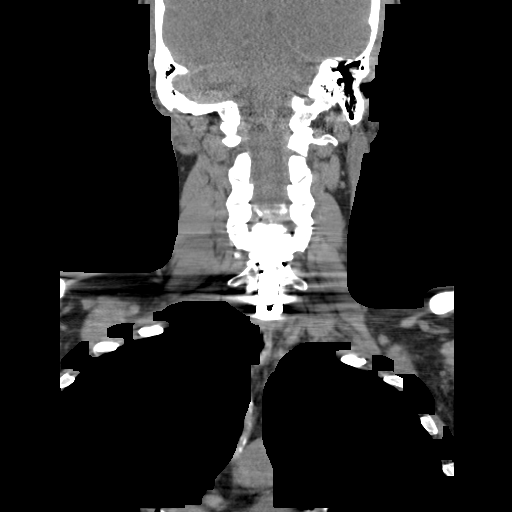
[im 64/127  bone]
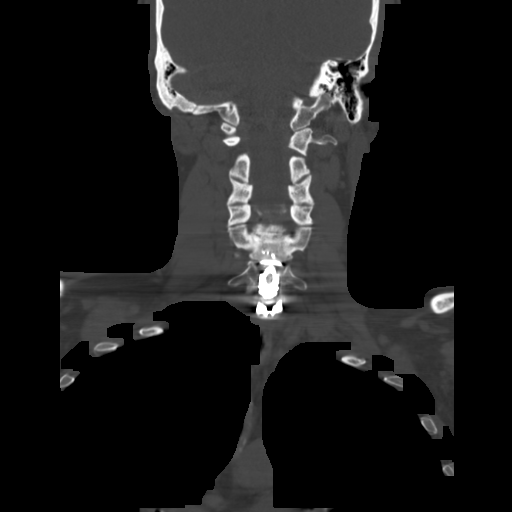
[im 74/127  bone]
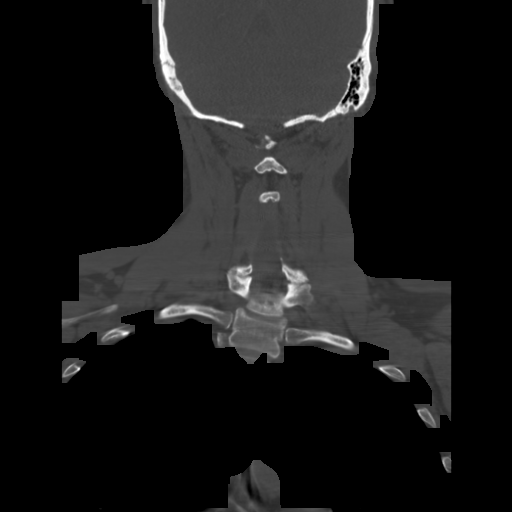
[im 85/127  bone]
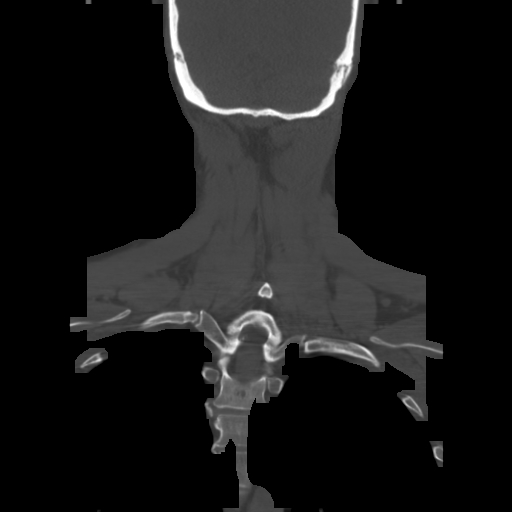

[Series 605: sag standard 2x2 · sagittal · 0.64mm/px · 3 of 127 slices shown]
[im 26/127  bone]
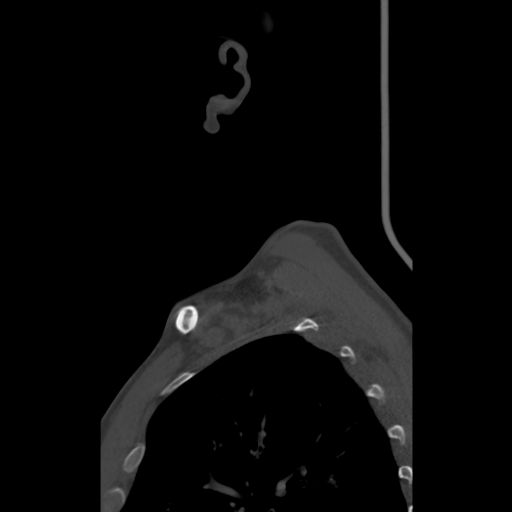
[im 51/127  bone]
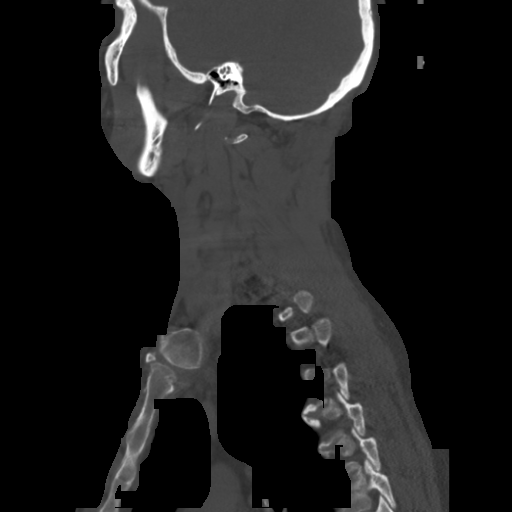
[im 76/127  bone]
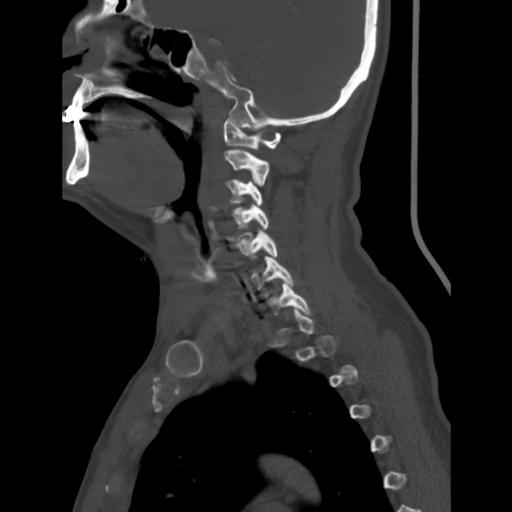

[16 of 33 positions shown; findings below may reference images not displayed]

FINDINGS: Motion-degraded study
Visualized intracranial contents are normal.
No discrete soft tissue mass or fluid collection is seen within the suprahyoid or infrahyoid neck. Specifically, no masses are visualized along the aerodigestive tract.
Parotid and submandibular glands appear symmetric and unremarkable bilaterally. The thyroid gland enhances homogeneously.
No pathologically enlarged cervical lymph nodes by size criteria.
Paranasal sinuses and mastoids are clear. Some nasal turbinates appear prominent and there is partial opacification the right ethmoid air cells.
Borderline enlarged lymph nodes in the superior mediastinum measuring up to 0.8 x 1.2 cm ([DATE]) lung apices are unremarkable.
fusion hardware spanning C3-C7. no acute fracture or malalignment is identified.
IMPRESSION: 1.  No acute abnormality in the neck.
2.  Borderline enlarged lymph nodes in the superior mediastinum, nonspecific in etiology.

## 2022-07-04 IMAGING — CR XR CHEST 1 VIEW
1 series · 1 of 1 positions shown · non-contrast
Comparison: 07/14/2021

SOB
FINAL REPORT:
INDICATION: dyspnea

[AP]
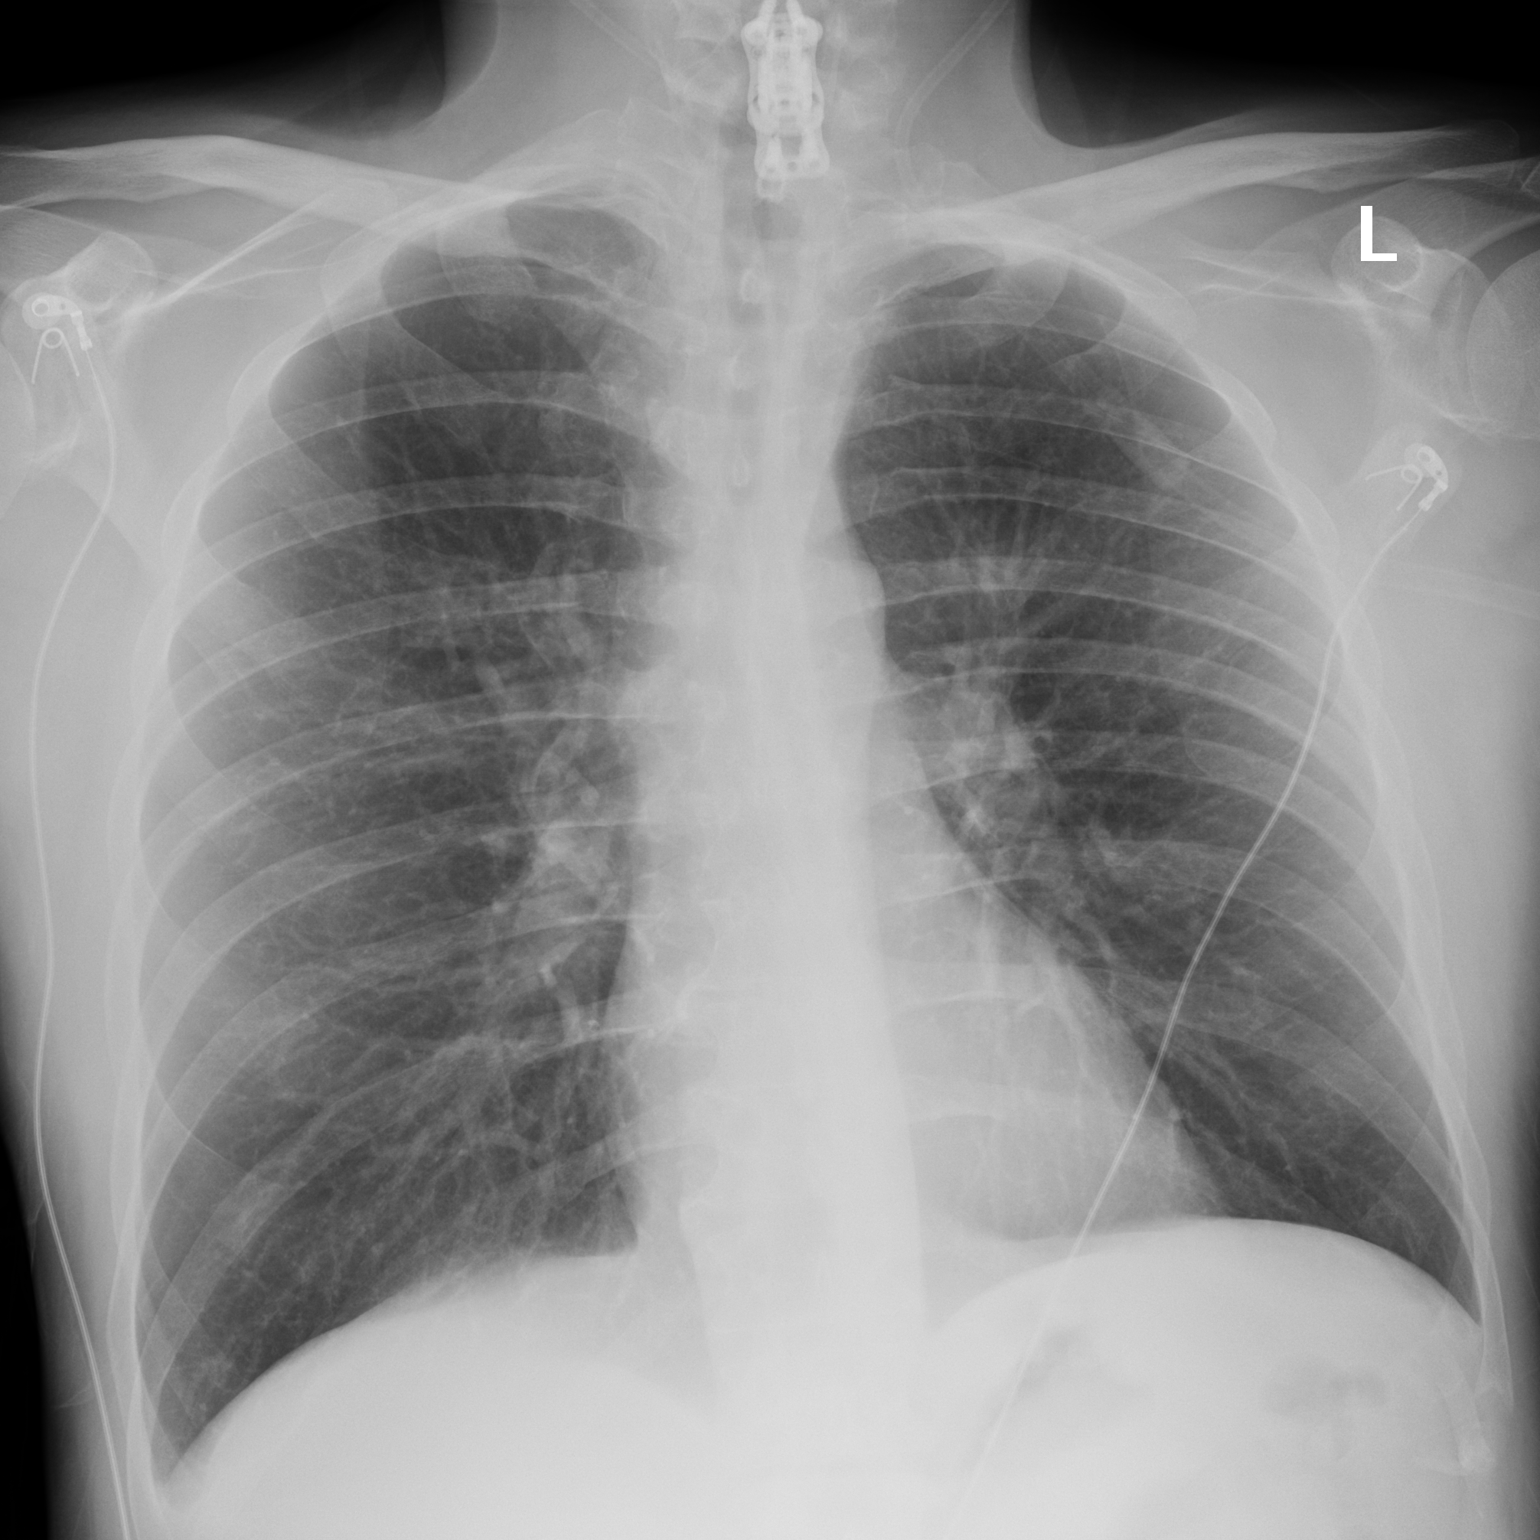

[1 of 1 positions shown; findings below may reference images not displayed]

FINDINGS: 1 view of the chest. The cardiomediastinal contours are within normal limits. No evidence of focal consolidation, pleural effusion, pulmonary edema, or pneumothorax.
IMPRESSION: 
IMPRESSION: No acute cardiopulmonary findings.

## 2022-07-27 IMAGING — MR MRI ORBIT FACE NECK WITH AND WITHOUT IV CONTRAST
7 of 12 series · 28 of 48 positions shown · non-contrast
Comparison: CT neck 06/13/2022

SOFT TISSUE NECK, DIFFICULTY BREATHING MASS
FINAL REPORT:
MRI ORBIT FACE NECK WITH AND WITHOUT IV CONTRAST
INDICATION: Difficulty breathing, mass.
TECHNIQUE: Multiplanar multisequence MR imaging of the neck was performed with and without intravenous contrast.

[Series 3001: T1 · sagittal · 4.0mm · 1.17mm/px · 2 of 38 slices shown (1 of 3)]
[im 1/38]
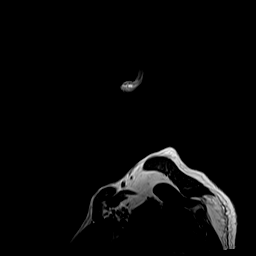
[im 38/38]
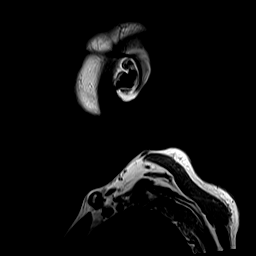

[Series 5001: T1 · coronal · 4.0mm · 0.94mm/px · 4 of 50 slices shown (2 of 3)]
[im 1/50]
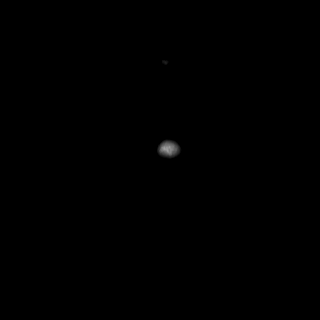
[im 17/50]
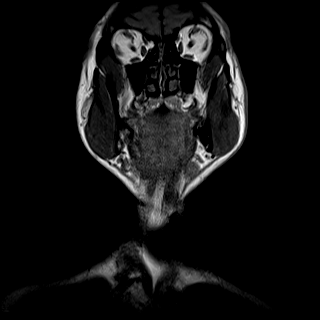
[im 33/50]
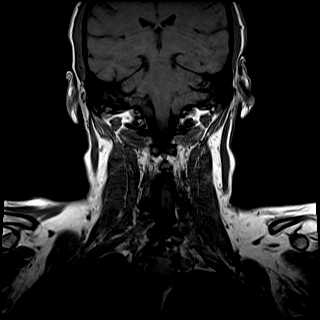
[im 50/50]
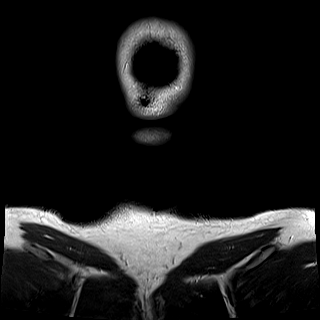

[Series 7001: T1 · axial · 5.0mm · 0.39mm/px · z∈[-83,+214]mm · 5 of 55 slices shown (3 of 3)]
[im 1/55]
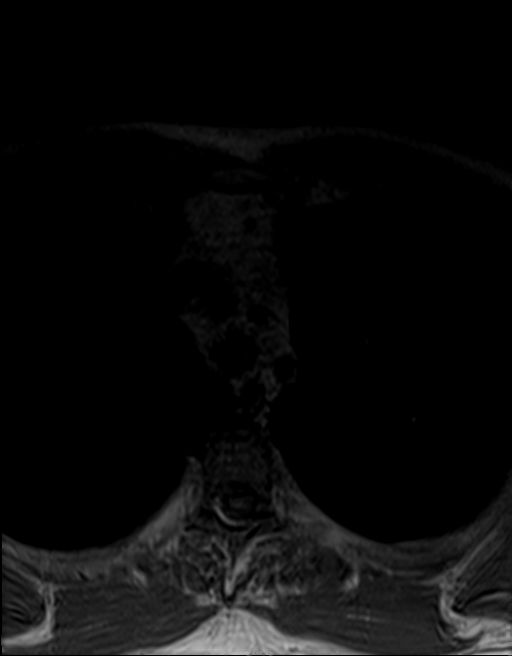
[im 14/55]
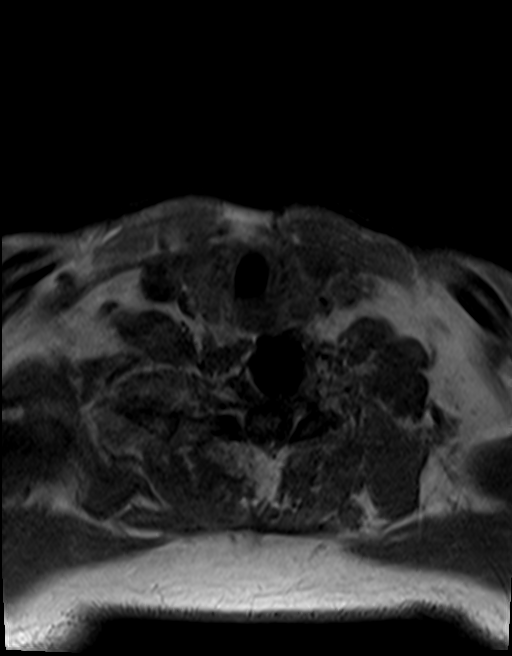
[im 28/55]
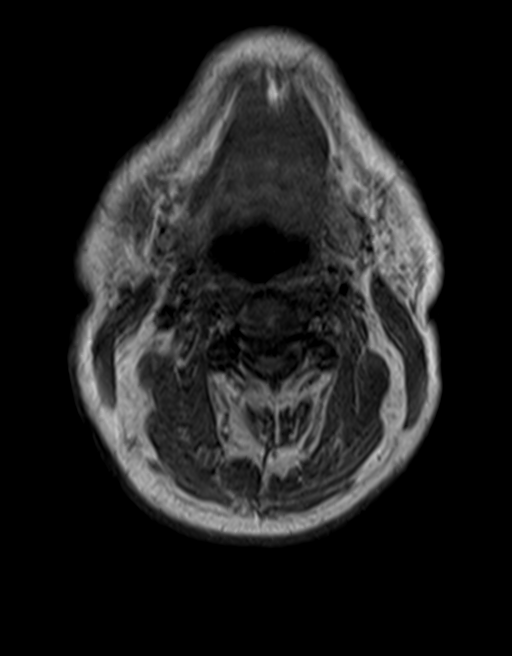
[im 41/55]
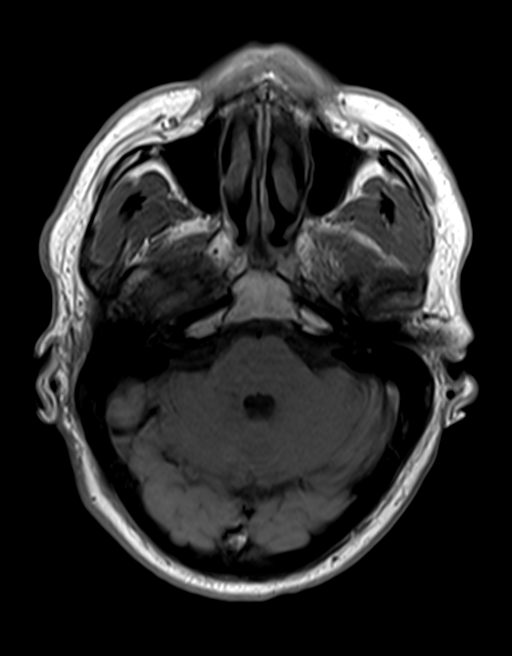
[im 55/55]
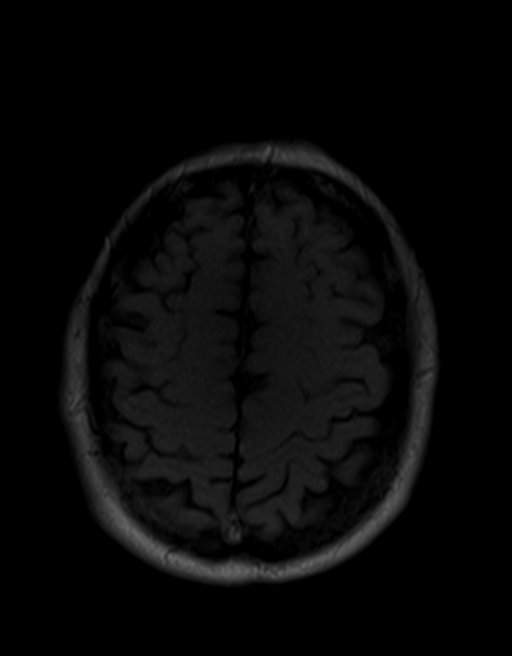

[Series 8001: T2 fat-sat · axial · 5.0mm · 0.78mm/px · z∈[-82,+215]mm · 5 of 55 slices shown (1 of 2)]
[im 1/55]
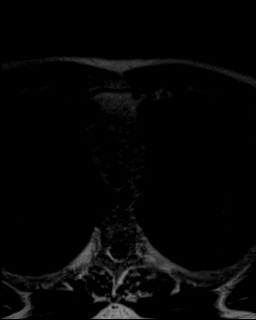
[im 14/55]
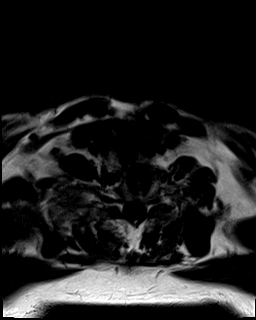
[im 28/55]
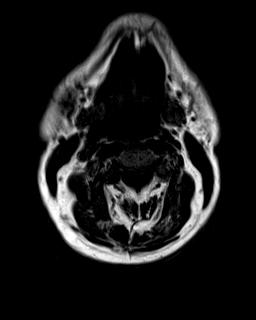
[im 41/55]
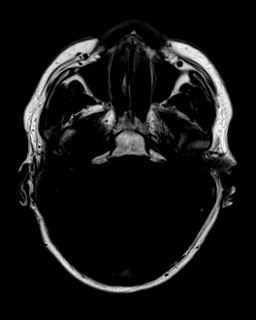
[im 55/55]
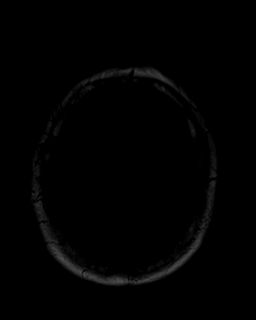

[Series 9001: T2 fat-sat · axial · 5.0mm · 0.78mm/px · z∈[-82,+215]mm · 5 of 55 slices shown (2 of 2)]
[im 1/55]
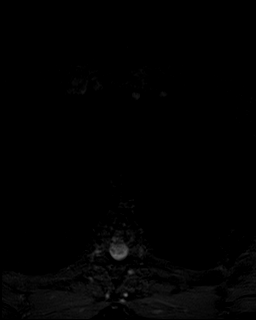
[im 14/55]
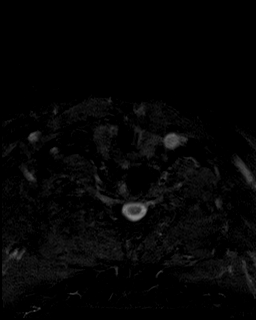
[im 28/55]
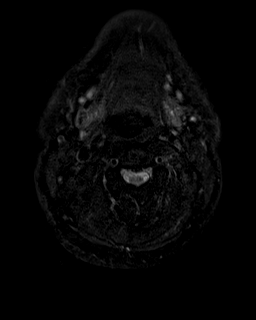
[im 41/55]
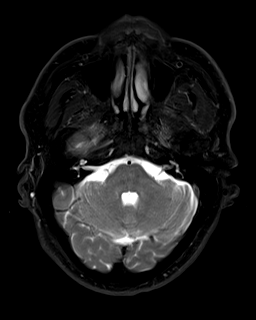
[im 55/55]
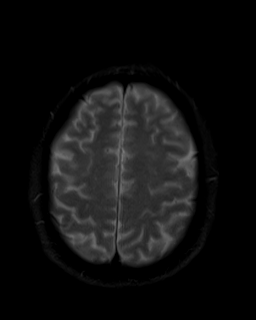

[T1 fat-sat post-contrast · axial · 5.0mm · 0.78mm/px · z∈[-83,-11]mm · 2 of 55 slices shown]
[im 1/55]
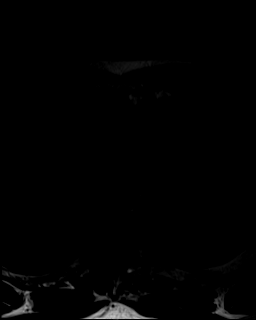
[im 14/55]
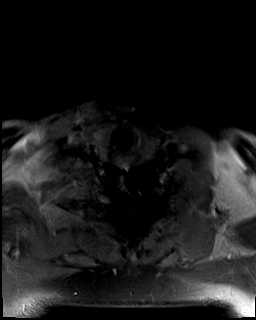

[T1 post-contrast · axial · 5.0mm · 0.39mm/px · z∈[-83,+214]mm · 5 of 55 slices shown]
[im 1/55]
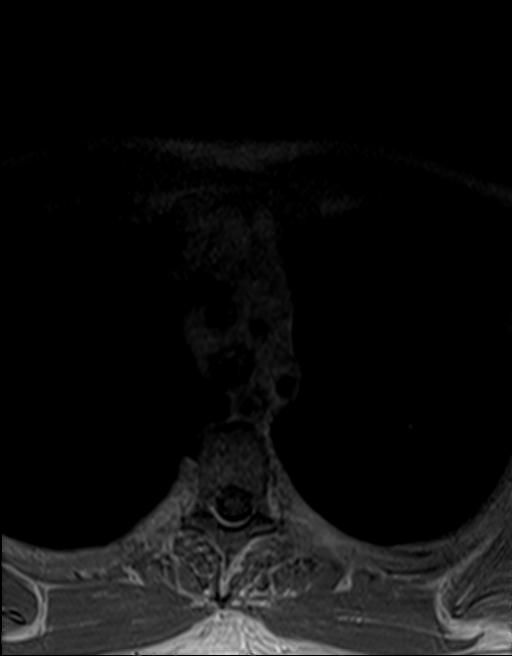
[im 14/55]
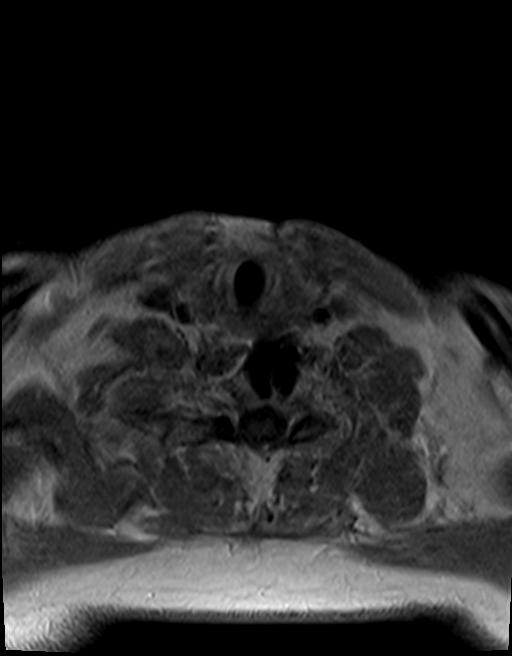
[im 28/55]
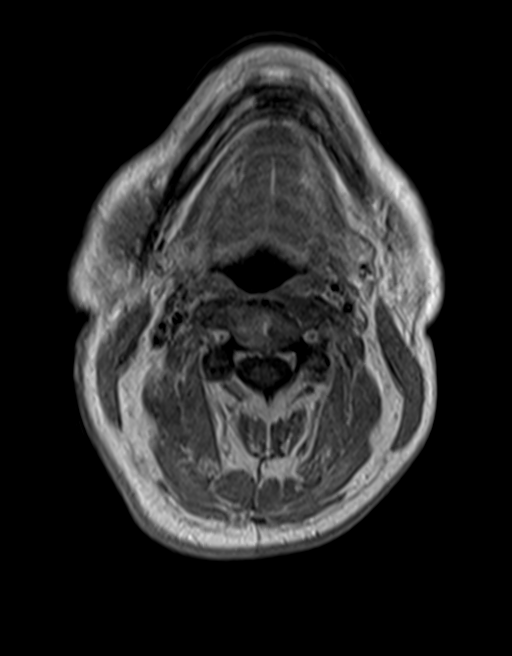
[im 41/55]
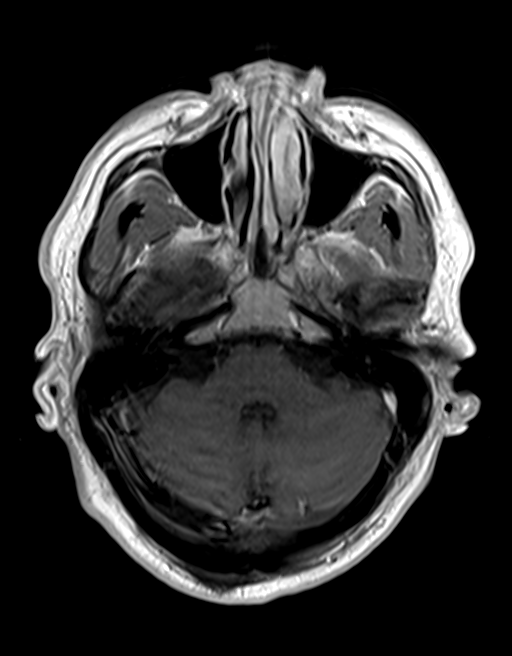
[im 55/55]
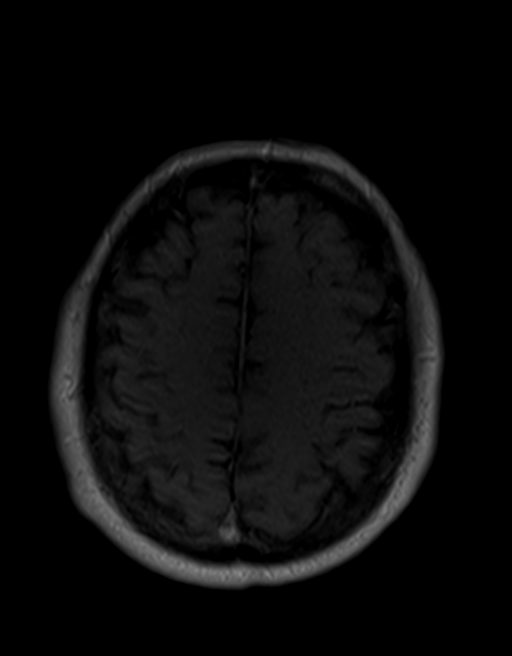

[28 of 48 positions shown; findings below may reference images not displayed]

FINDINGS: The imaged portions of the brain are normal. The orbits and globes are within normal limits. Small amount of fluid in the right mastoid. Mild mucosal thickening in the ethmoid air cells.
The nasopharynx, oropharynx, floor of mouth, and base of tongue are normal. The parapharyngeal and masticator spaces are normal. The epiglottis, aryepiglottic folds, vocal cords, and laryngeal cartilages are unremarkable.
There is tracheal thickening with moderate narrowing (for example image 5 series 2553). The tracheal thickening is relatively spared posteriorly.
The thyroid, submandibular, and parotid glands are unremarkable. Scattered nonenlarged cervical lymph nodes at multiple levels. No suspicious lymphadenopathy. No fluid collections. No suspicious marrow lesions. Postsurgical changes from ACDF at C3-C7 with laminectomy changes at C4-C6.
IMPRESSION: 
IMPRESSION: Tracheal thickening with moderate narrowing which is new since 01/24/2021 but has not significantly changed since 06/24/2021. Differential diagnosis includes post intubation stenosis, infectious/inflammatory processes, tracheobronchopathia osteochondroplastica, and relapsing polychondritis among other entities.

## 2022-08-16 IMAGING — DX XR CHEST 1 VIEW
1 series · 2 of 2 positions shown · non-contrast
Comparison: 07/04/2022
There is no acute parenchymal consolidation.

SOB
FINAL REPORT:
Chest AP portable:
CLINICAL INDICATION: Shortness of breath

[Series 5049: AP · U · 0.10mm/px · 2 of 2 slices shown]
[im 1/2]
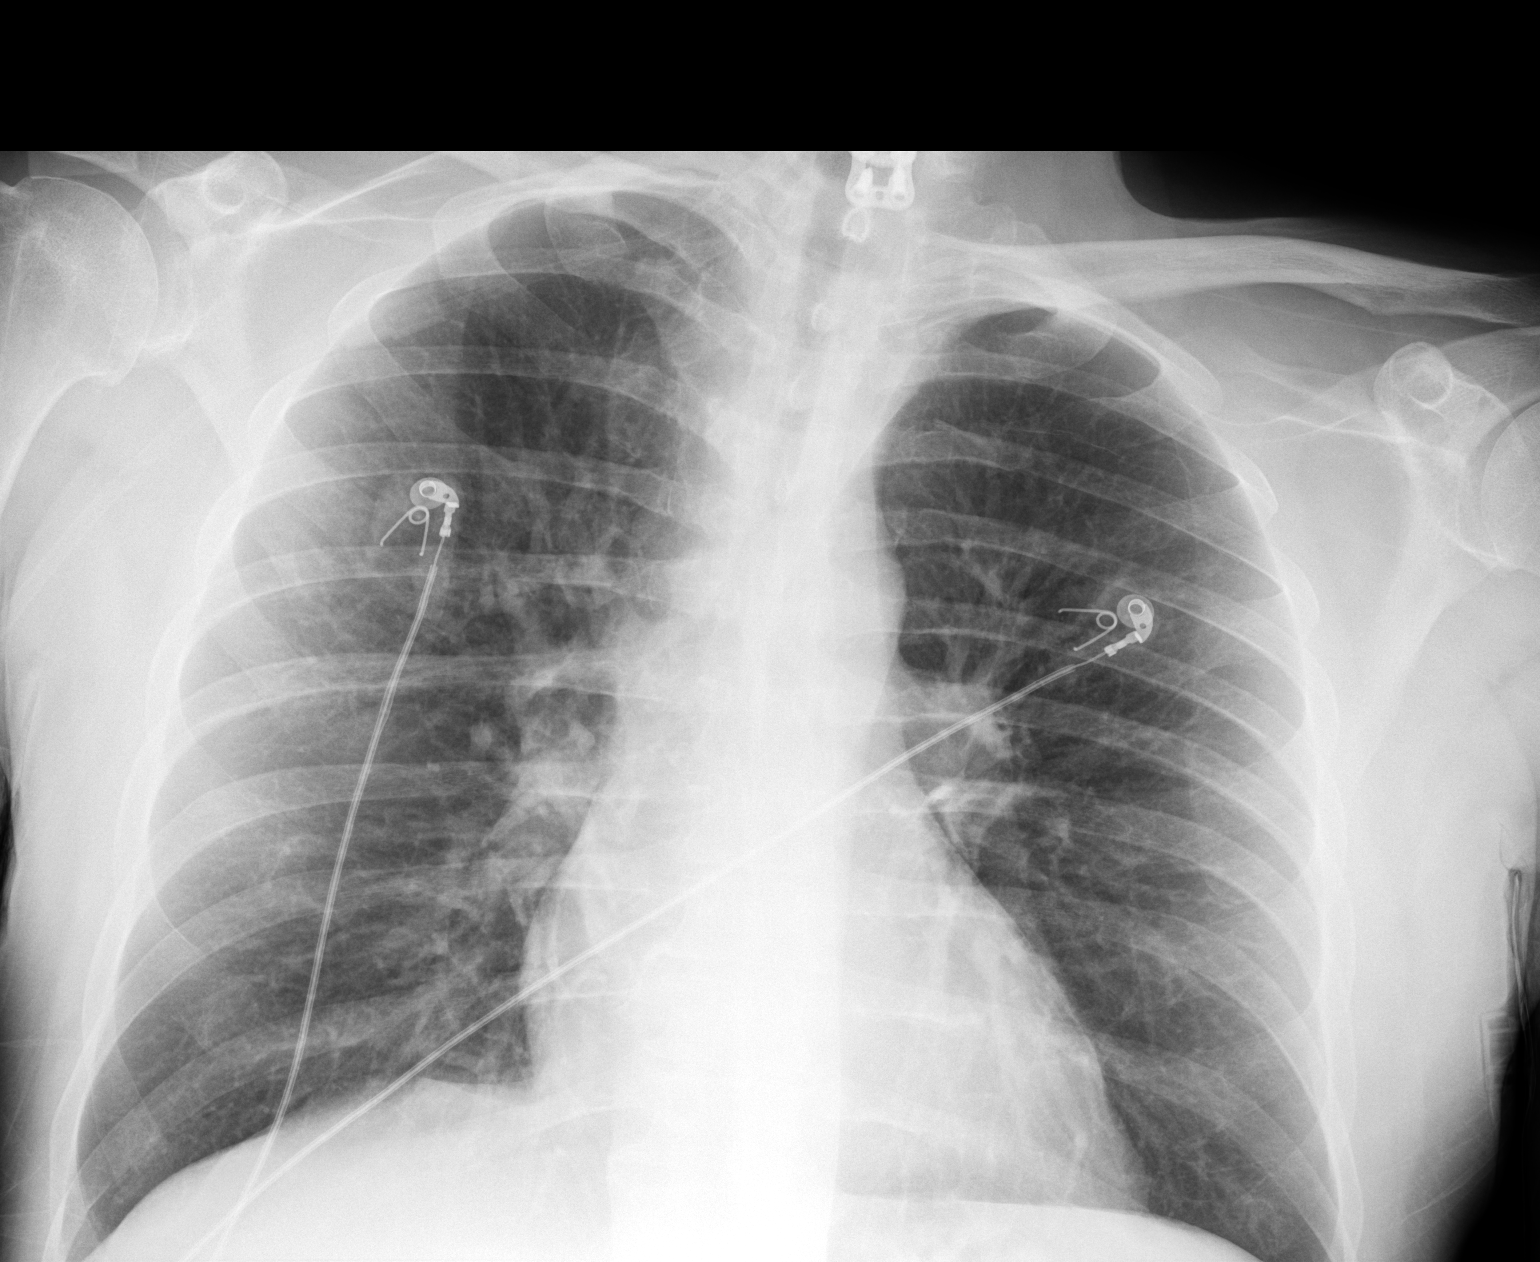
[im 2/2]
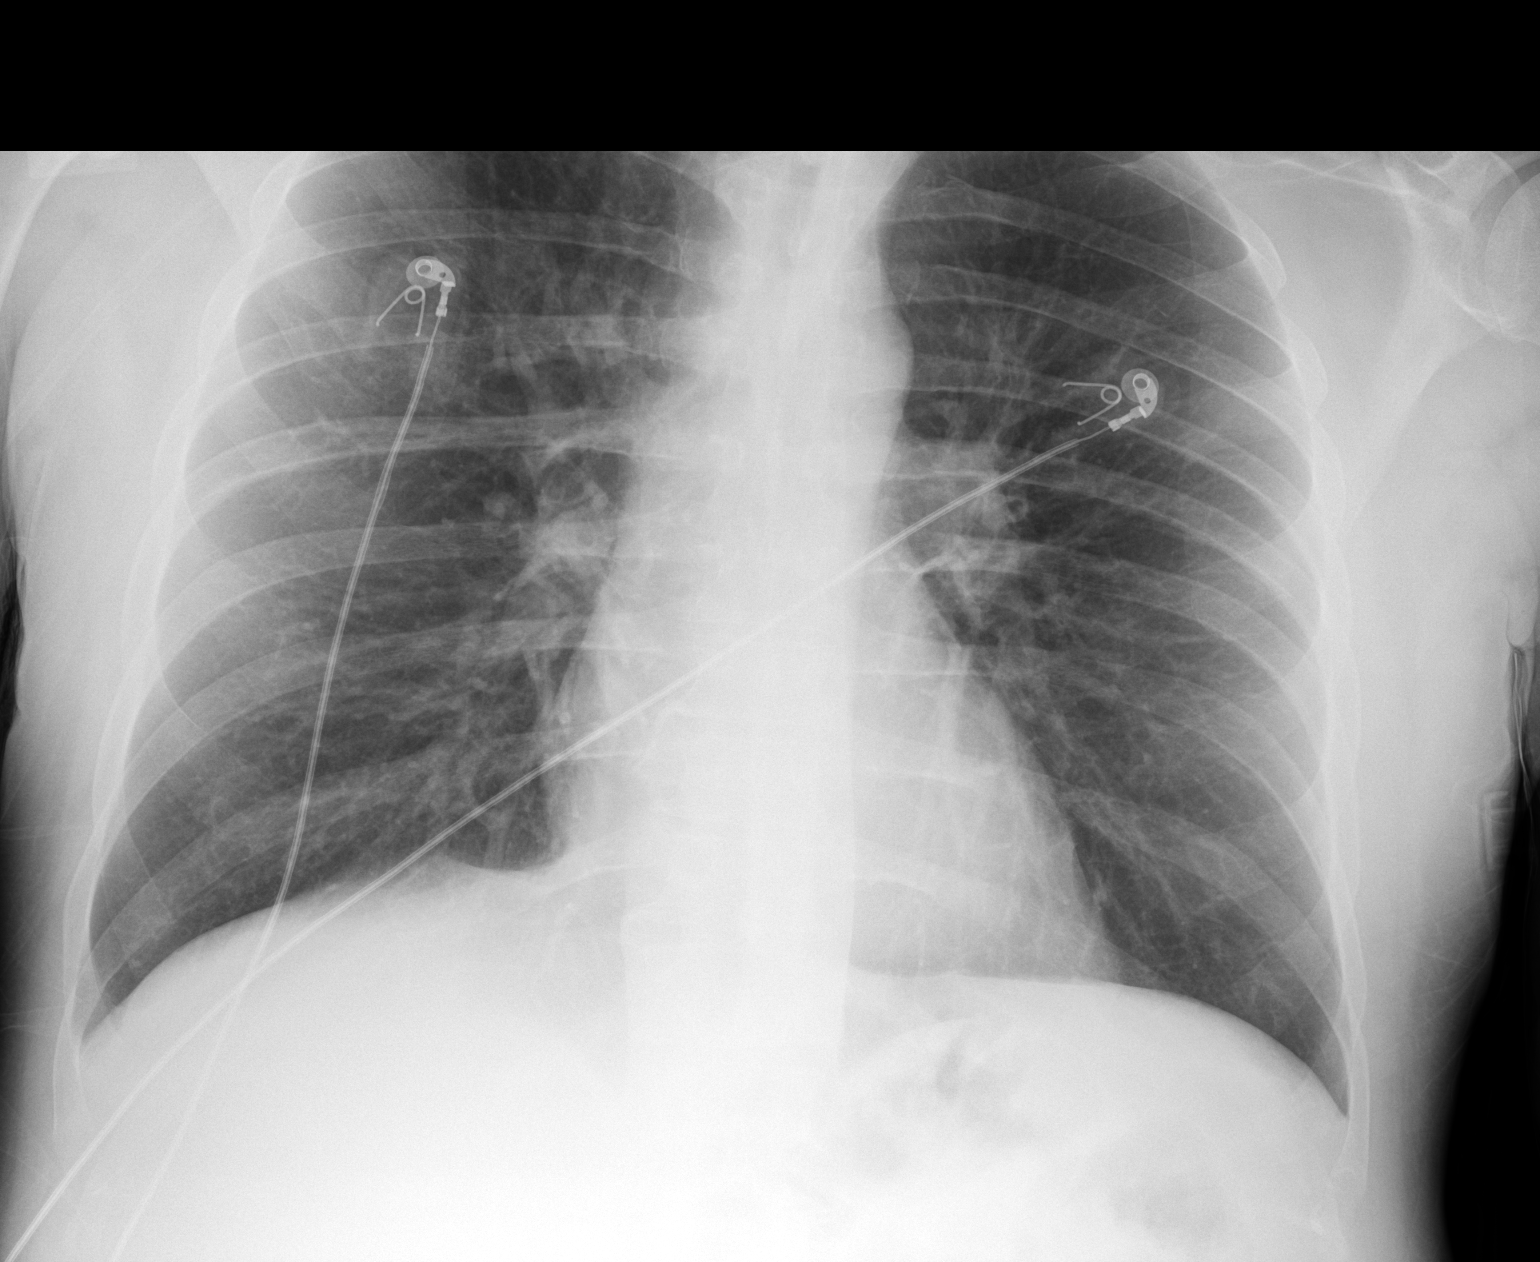

[2 of 2 positions shown; findings below may reference images not displayed]

No vascular congestion, pneumothorax or gross effusion is seen. Heart appears of normal size. The skeletal structures appear intact.
IMPRESSION: No acute intrathoracic process. No significant change.

## 2022-08-16 IMAGING — CT CT CHEST WITHOUT CONTRAST
2 of 4 series · 15 of 36 positions shown, 18 images · non-contrast
Comparison: 06/13/2022
Axial spiral CT acquisition was performed from the base of the neck through the upper abdomen without contrast.

SOB
NO SX
NO CA
FINAL REPORT:
CT CHEST:
CLINICAL INDICATION: Shortness of breath

[Series 2: chest w/o · axial · non-contrast · 0.78mm/px · z∈[-345,-28]mm · 12 of 151 slices shown, 15 images]
[im 12/151  mediastinal]
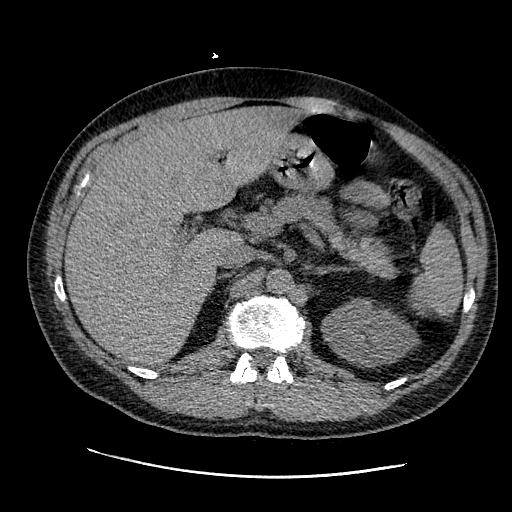
[im 12/151  lung]
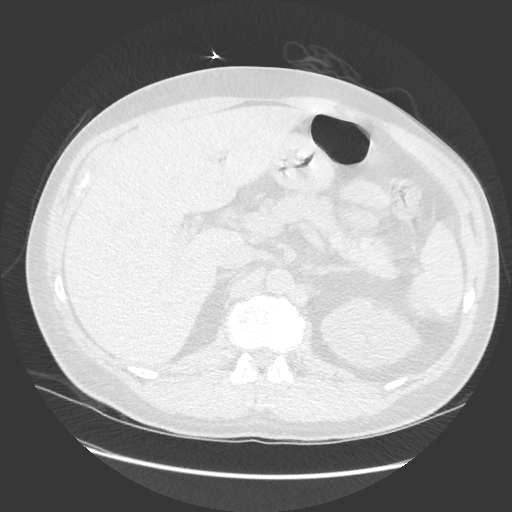
[im 24/151  lung]
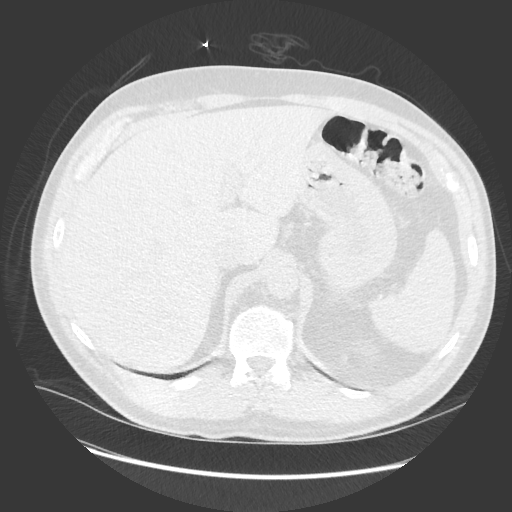
[im 35/151  lung]
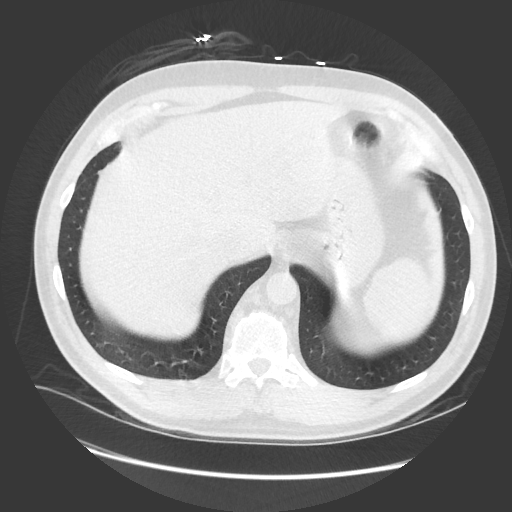
[im 47/151  lung]
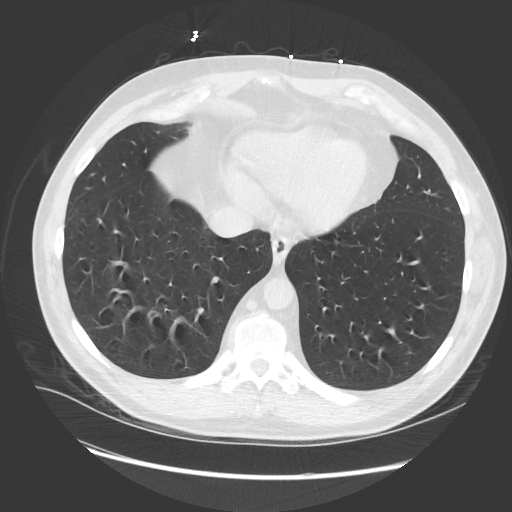
[im 58/151  mediastinal]
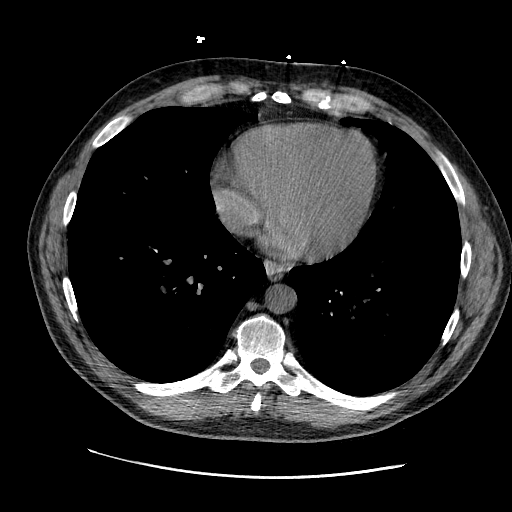
[im 58/151  lung]
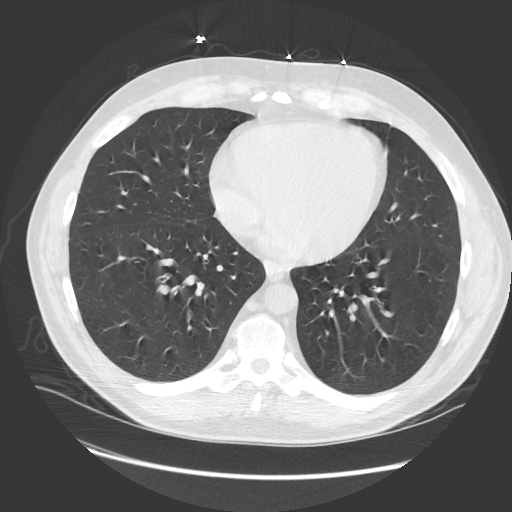
[im 70/151  lung]
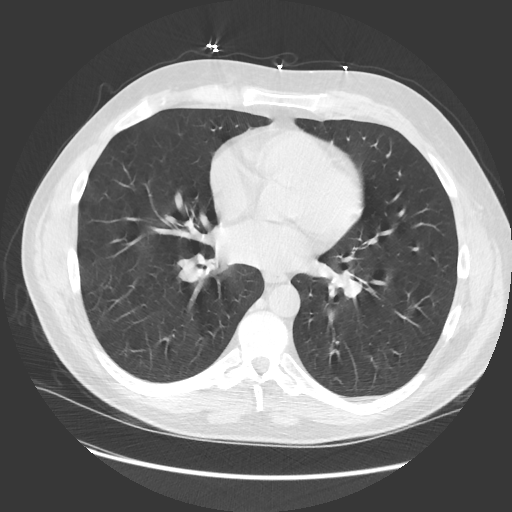
[im 81/151  lung]
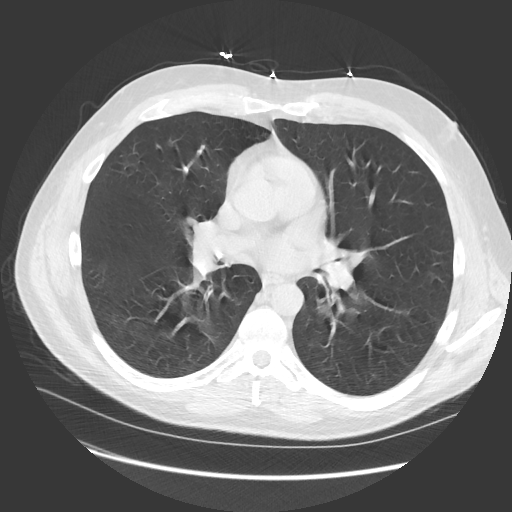
[im 93/151  lung]
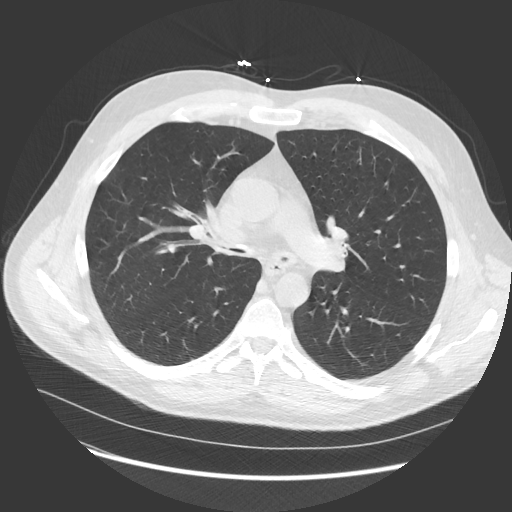
[im 104/151  mediastinal]
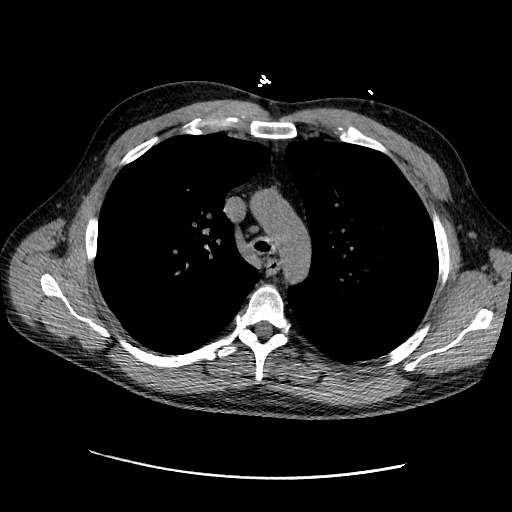
[im 104/151  lung]
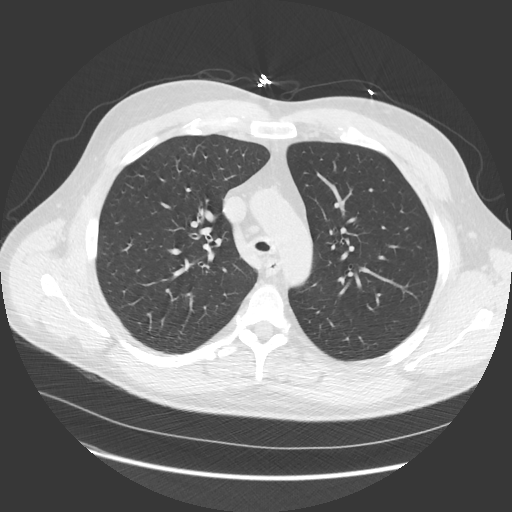
[im 116/151  lung]
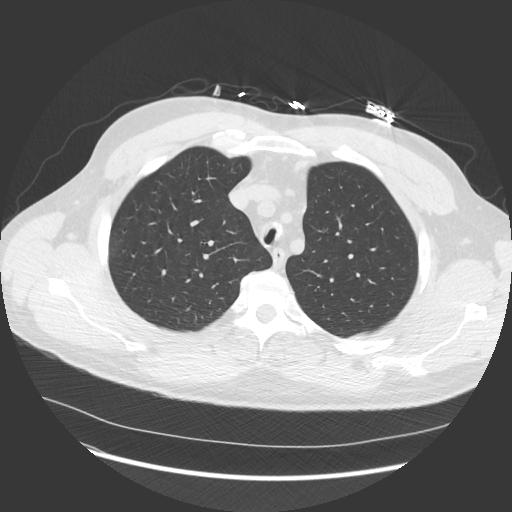
[im 127/151  lung]
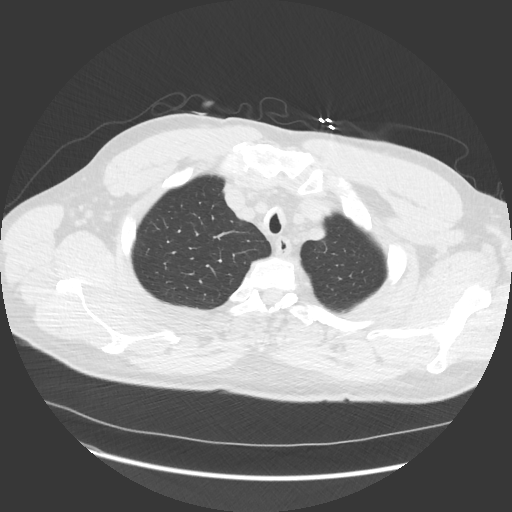
[im 139/151  lung]
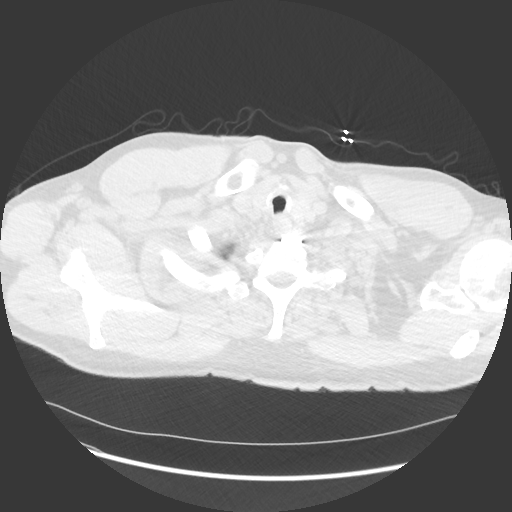

[Series 601: sag standard 2x2 · sagittal · 0.78mm/px · 3 of 200 slices shown]
[im 40/200  lung]
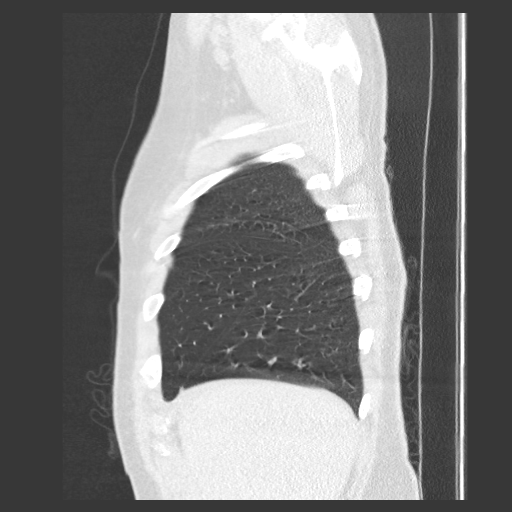
[im 80/200  lung]
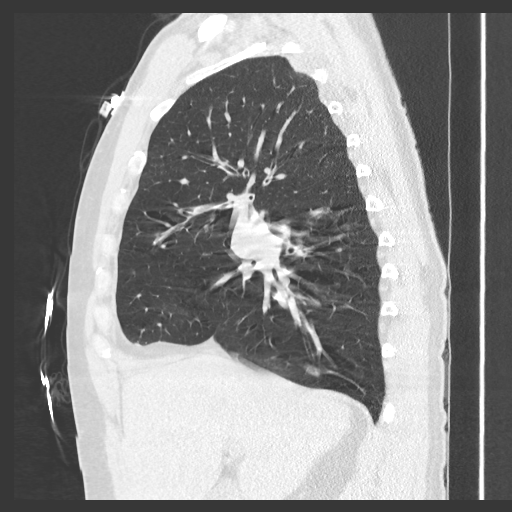
[im 120/200  lung]
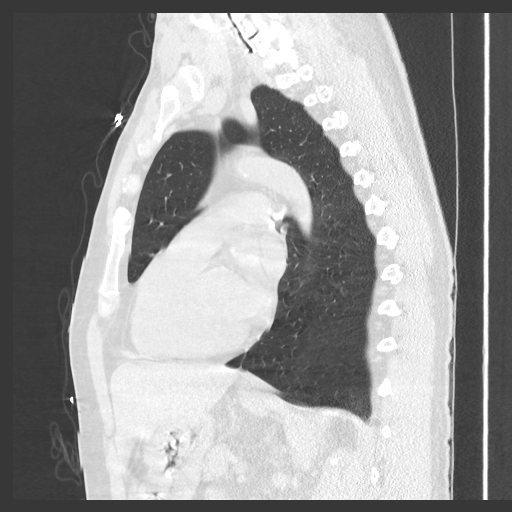

[15 of 36 positions shown; findings below may reference images not displayed]

All CT scans at this facility use iterative reconstruction technique, dose modulation and/or weight based dosing when appropriate to reduce radiation dose to as low as reasonably achievable.
No axillary lymphadenopathy or chest wall mass is appreciated. There is mild thoracic spine degenerative disease. The heart appears of normal size. No coronary artery calcifications are appreciated. There is no mediastinal adenopathy. The central airways and esophagus appear unremarkable.
The lungs appear well expanded. There is no acute infiltrate. No interstitial disease or groundglass opacity is demonstrated. No lung mass or nodule is seen. There is no pneumothorax or effusion.
IMPRESSION: Unremarkable CT of the chest.

## 2022-08-23 IMAGING — CT CT HEART CORONARY ANGIOGRAM
1 series · 1 of 16 positions shown · IV contrast (agent unspecified)
Comparison: none

anomalous coronary anatomy , sob
Cardiac Computed tomography angiography, heart, and coronary arteries
Patient Name: Susanna Steck
ID: [id number]
Procedure: Computed tomography angiography, heart, and coronary arteries, with contrast material, including 3D image postprocessing (including evaluation of cardiac structure and morphology) done with [employer] ISP software.
Acquisition mode: Perspective ECG triggering
Scanner:  [id number]

[Series 3020: batch - acute marginal, straight mpr, 75% · 1 of 16 slices shown]
[im 9/16]
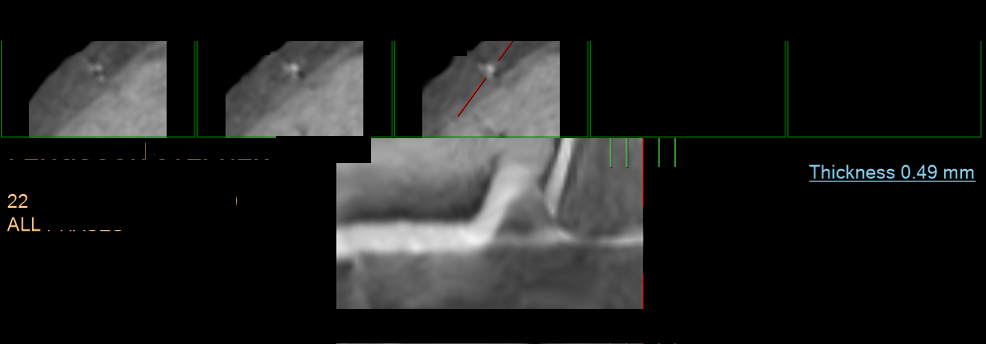

[1 of 16 positions shown; findings below may reference images not displayed]

FINDINGS: Coronary calcium: Agatston score of 0.  Low risk.
Coronary angiography:
The left main is absent.
The LAD arises from the right coronary cusp, the vessel has an anterior course and transverses between the thoracic aorta and pulmonary artery.  This is a malignant course.  The mid LAD is poorly visualized (courses between the aorta and pulmonary artery) the distal LAD is free of any significant plaque or stenosis.
The circumflex arises from the right coronary cusp and then takes a posterior course.  This is a benign course.  The circumflex is free of any significant plaque or stenosis.  Gives rise to 2 OM branches which are both free of any significant plaque or stenoses.
The right coronary artery arises from the right coronary cusp.  Proximal RCA is free of any significant plaque or stenosis.  The mid RCA has substantial stitch artifact.  Distal RCA is free of any significant plaque or stenosis.
Pulmonary Veins: There are 4 pulmonary veins without any significant abnormality
Pericardium:  No significant pericardial effusion
Aorta:
No thoracic aneurysm or dissection noted
Extra-Cardiac Findings: Refer to radiology findings on noncardiac structures reported separately.
IMPRESSION: Anomalous coronary arteries with the LAD with a malignant course.
The LAD arises from the right coronary cusp, the vessel has an anterior course and transverses between the thoracic aorta and pulmonary artery.  This is a malignant course.  The mid LAD is poorly visualized (courses between the aorta and pulmonary artery), the distal LAD is free of any significant plaque or stenosis.
The circumflex arises from the right coronary cusp and then takes a posterior course.  This is a benign course.
Coronary calcium: Coronary calcium: Agatston score of 0.  Low risk.
Recommendation: CT surgery evaluation to consider LAD revascularization (malignant course)
Survo, [place]
Thank you for the referral.

## 2022-08-29 IMAGING — CR XR CHEST 1 VIEW
1 series · 1 of 1 positions shown · non-contrast
Comparison: None

FINAL REPORT:
EXAM: XR CHEST 1 VIEW
CLINICAL INDICATION: SOB

[AP]
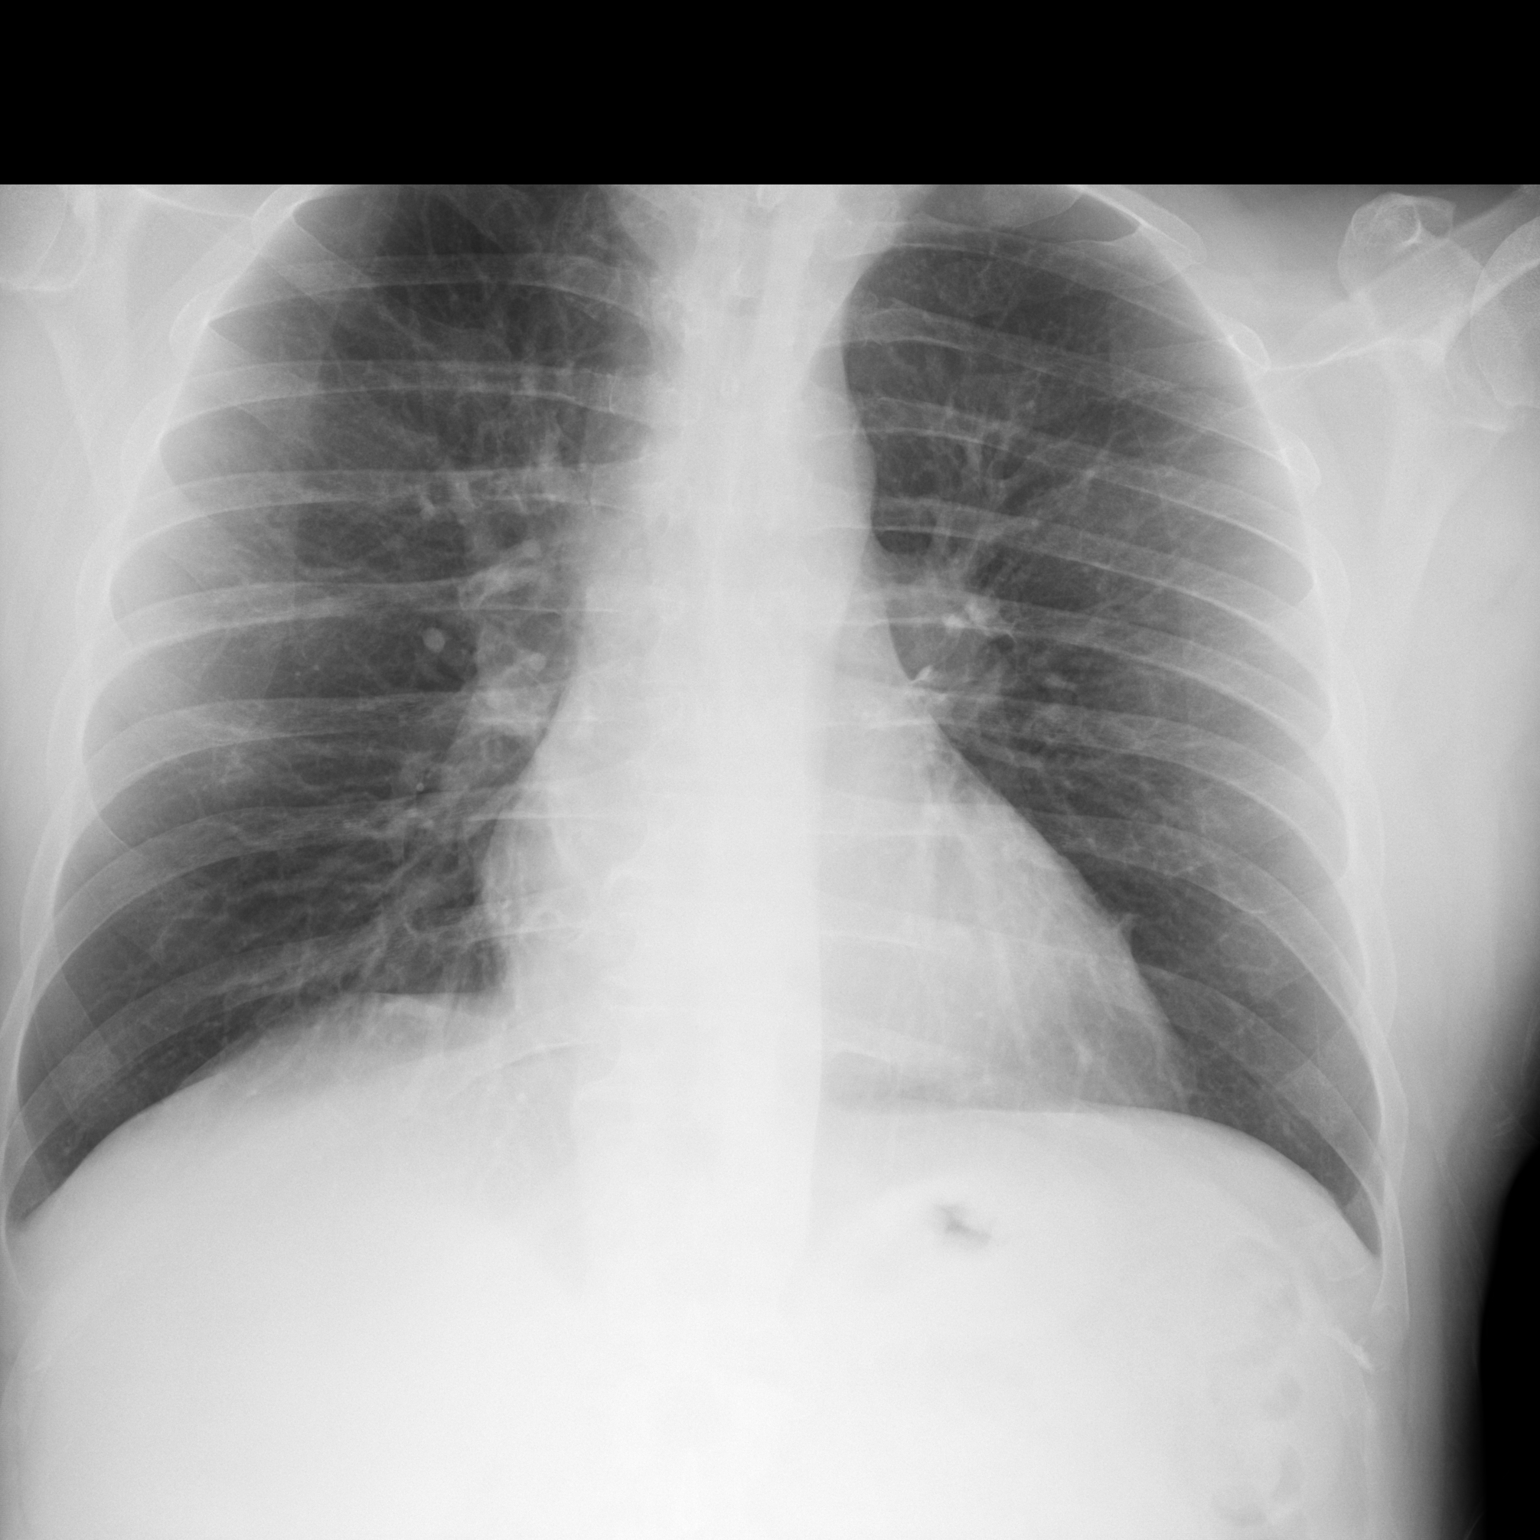

[1 of 1 positions shown; findings below may reference images not displayed]

FINDINGS: Support Devices: None
Lungs/Pleura: Lungs are clear. No pneumothorax. No pleural effusion.
Heart/Mediastinum: Normal.
Bones/Soft tissues: No acute abnormality.
IMPRESSION: No acute cardiopulmonary abnormality.

## 2022-09-27 IMAGING — DX XR CHEST 1 VIEW
1 series · 1 of 1 positions shown · non-contrast
Comparison: No relevant priors available for comparison.

FINAL REPORT:
XR CHEST 1 VIEW
CLINICAL INDICATION: possible foreign body / aspiration

[AP]
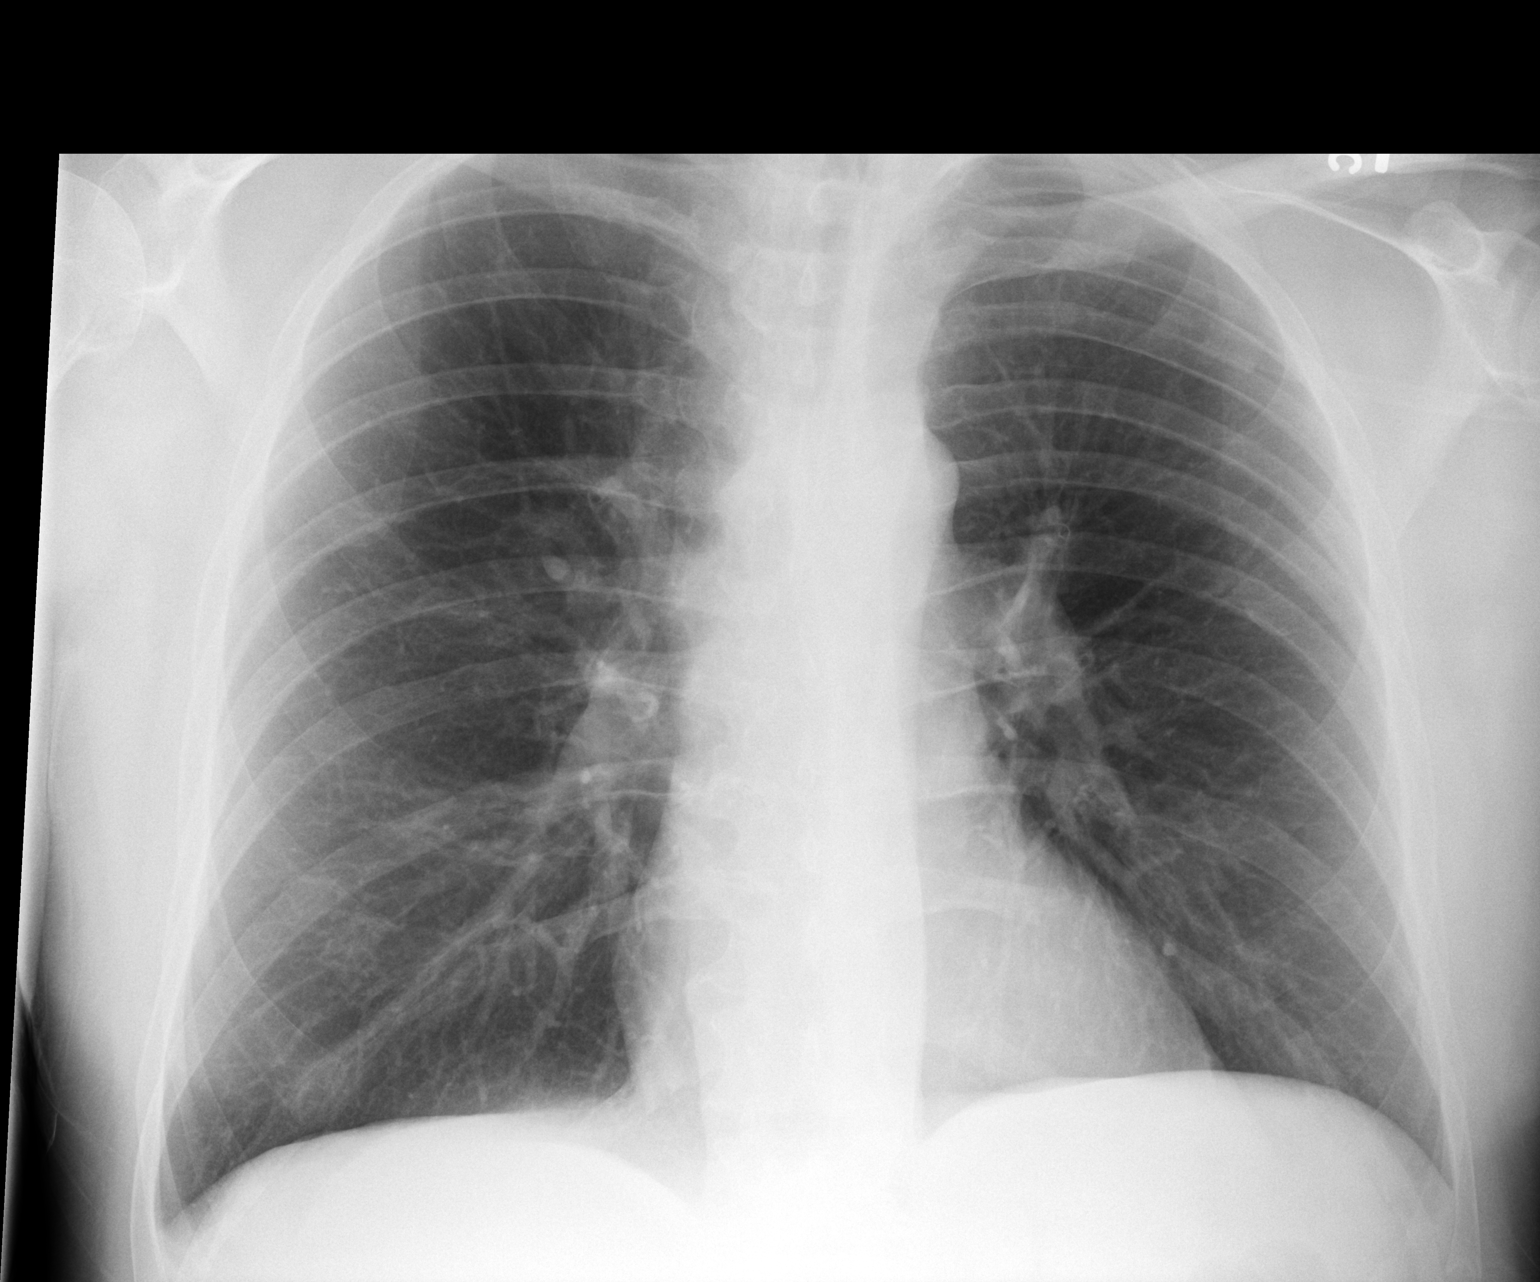

[1 of 1 positions shown; findings below may reference images not displayed]

FINDINGS: The cardiomediastinal silhouette is within normal limits for size.
No focal consolidation, pleural effusion or appreciable pneumothorax.
No acute osseous abnormality.
IMPRESSION: No acute finding.

## 2022-09-28 IMAGING — CT CT CHEST WITHOUT CONTRAST
2 of 4 series · 15 of 36 positions shown, 18 images · non-contrast
Comparison: 08/16/2022

Difficulty breathing
FINAL REPORT:
CT CHEST WITHOUT CONTRAST
INDICATION: Possible French fry aspiration, CT chest without contrast recommended by pulmonology
TECHNIQUE: Axial images with multiplanar reconstructions. No intravenous contrast administered.. Maximal intensity projections provided.

[Series 2: chest w/o · axial · non-contrast · 0.74mm/px · z∈[-361,-26]mm · 12 of 160 slices shown, 15 images]
[im 13/160  mediastinal]
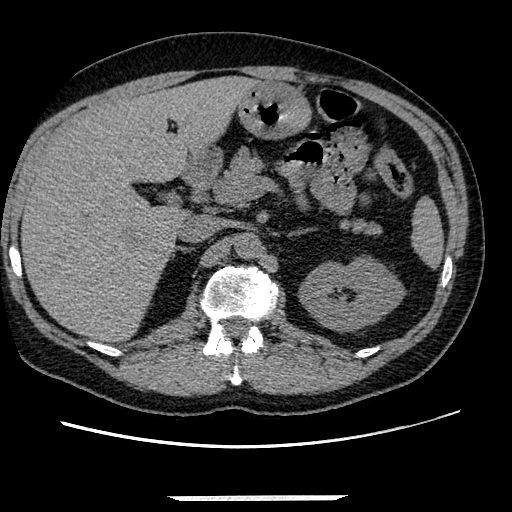
[im 13/160  lung]
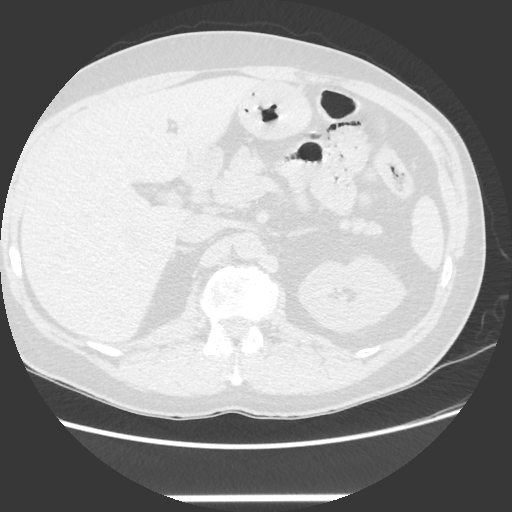
[im 25/160  lung]
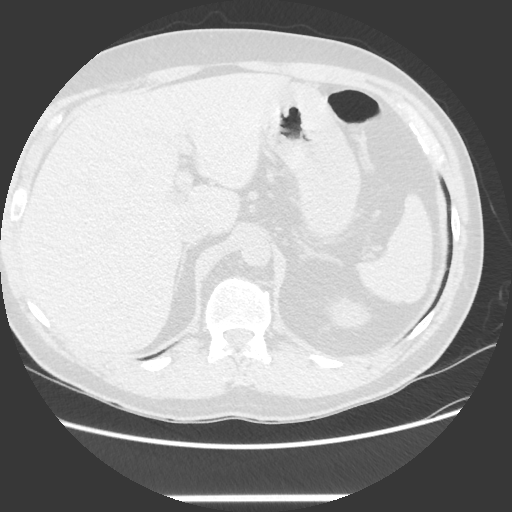
[im 37/160  lung]
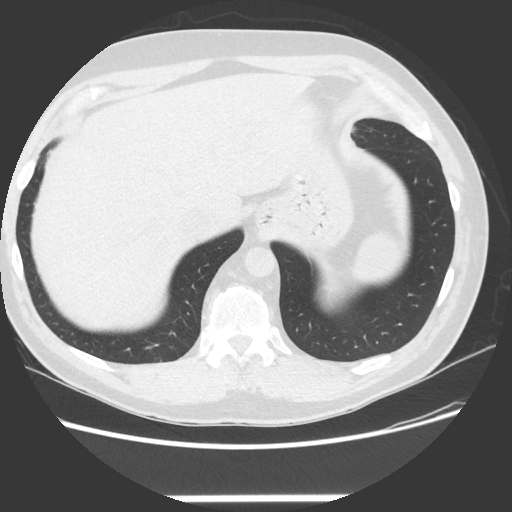
[im 49/160  lung]
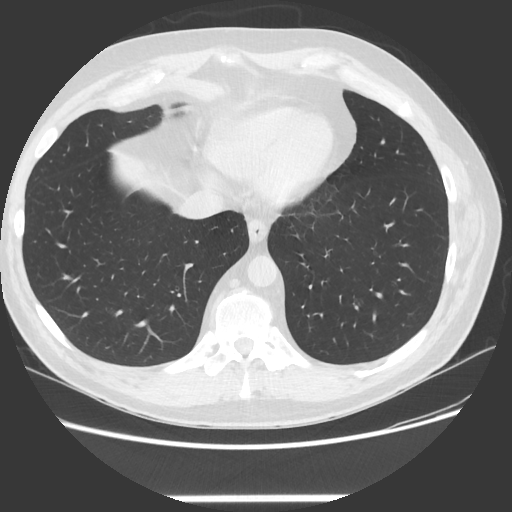
[im 62/160  mediastinal]
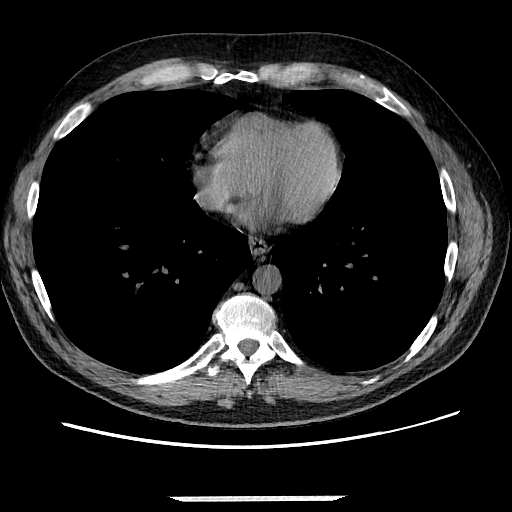
[im 62/160  lung]
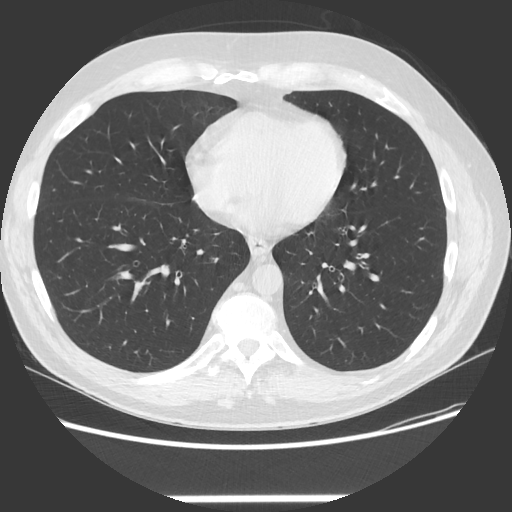
[im 74/160  lung]
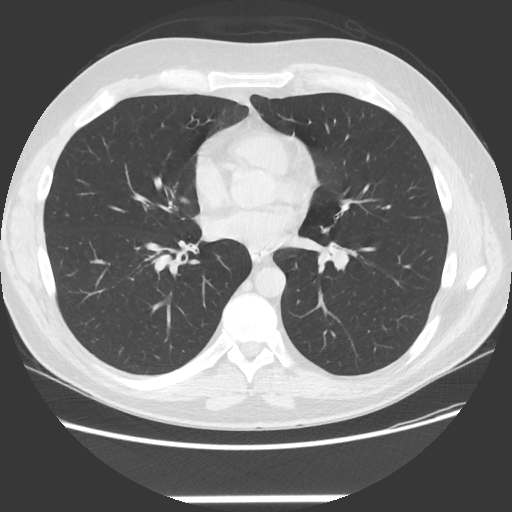
[im 86/160  lung]
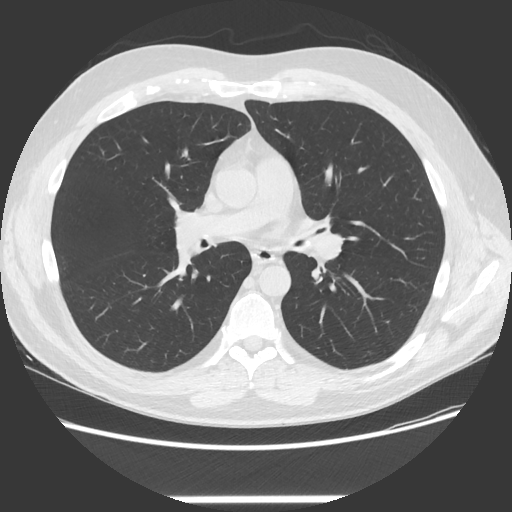
[im 98/160  lung]
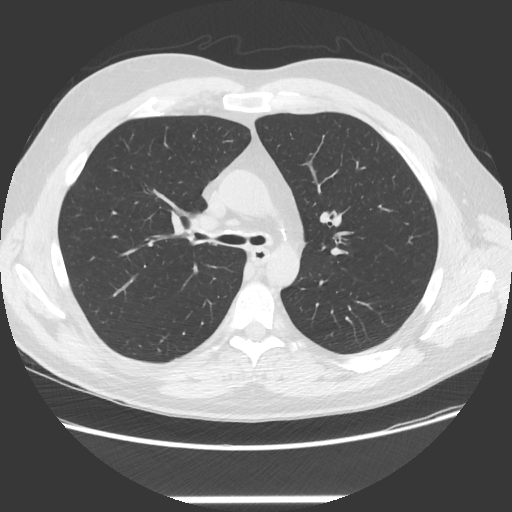
[im 111/160  mediastinal]
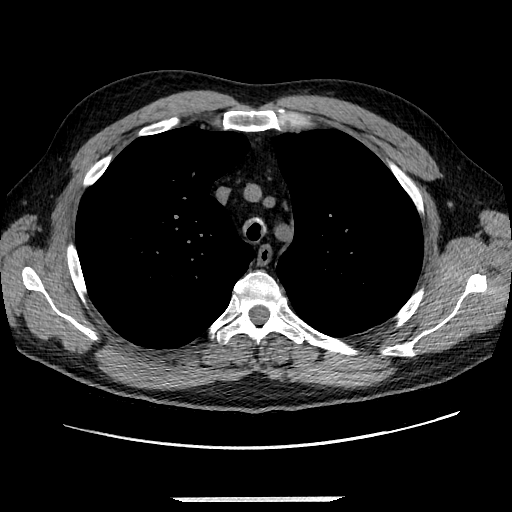
[im 111/160  lung]
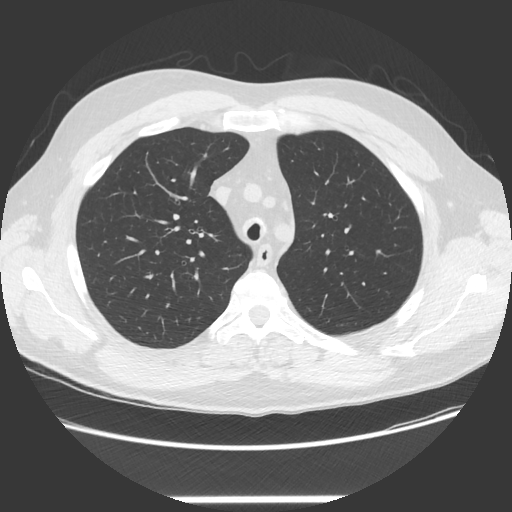
[im 123/160  lung]
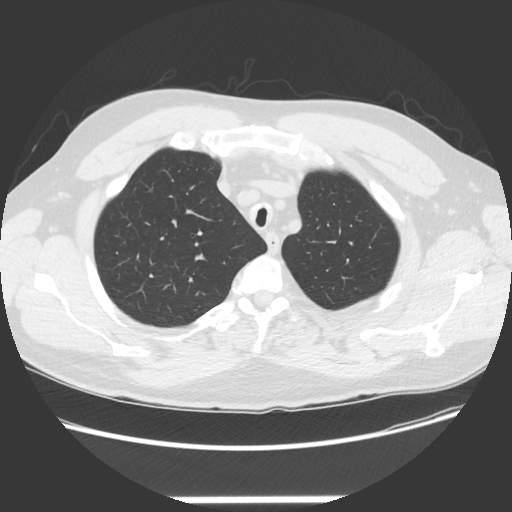
[im 135/160  lung]
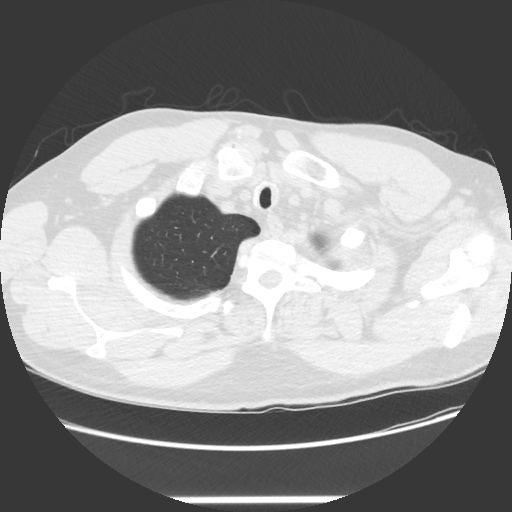
[im 147/160  lung]
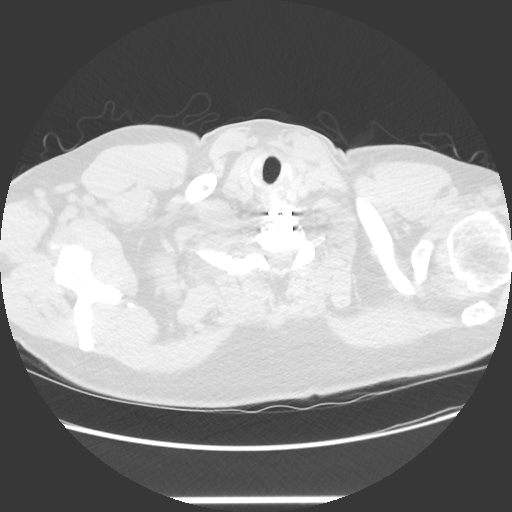

[Series 601: sag standard 2x2 · sagittal · 0.74mm/px · 3 of 191 slices shown]
[im 39/191  lung]
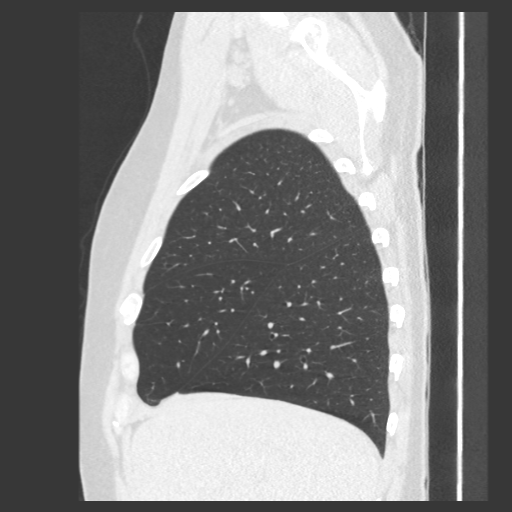
[im 77/191  lung]
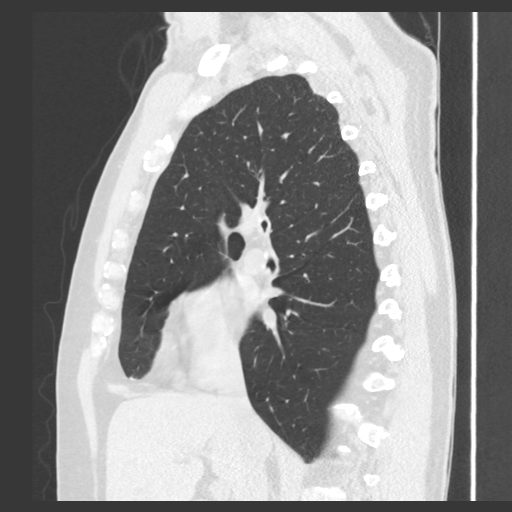
[im 115/191  lung]
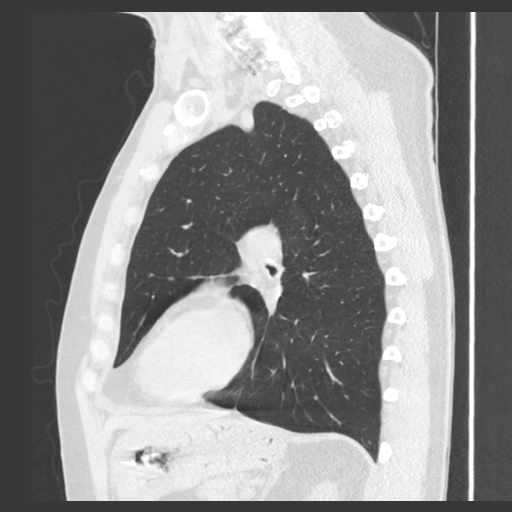

[15 of 36 positions shown; findings below may reference images not displayed]

FINDINGS: MEDICAL DEVICES: None.
THYROID/NECK BASE: Visualized aspects of the thyroid are within normal limits.
VASCULATURE: Normal caliber thoracic aorta.
HEART: Normal size. No pericardial effusion.
LYMPH NODES: No lymphadenopathy by size criteria within limits of noncontrast exam.
PARTIALLY IMAGED UPPER ABDOMEN: Unremarkable.
AIRWAY/ESOPHAGUS: Trachea is well aerated without definitive radiopaque foreign body. Wall thickening with calcification throughout the entire length of the trachea extending to the mainstem bronchi.
LUNGS/PLEURA: No focal consolidation, pleural effusion or pneumothorax.
MUSCULOSKELETAL: Partially imaged cervical fusion hardware.
IMPRESSION: No radiopaque foreign body in the visualized airways.
Persistent thickening of the trachea and mainstem bronchi which is nonspecific but could be seen with tracheobronchopathia or relapsing polychondritis.
Note:  All CT exams at this facility are performed with dose modulation, iterative reconstruction, and/or weight based dosing when appropriate to reduce radiation exposure to As Low As Reasonably Achievable (VAHEED).

## 2022-10-24 IMAGING — CT CT CHEST WITHOUT CONTRAST
2 of 5 series · 15 of 36 positions shown, 18 images · non-contrast
Comparison: 09/28/2022

FINAL REPORT:
CT CHEST WITHOUT CONTRAST
INDICATION: Chronic cough.
TECHNIQUE: CT of the chest was performed without contrast. Coronal and sagittal reformats were submitted for evaluation.

[Series 302: thins · axial · 0.88mm/px · z∈[-377,-41]mm · 12 of 605 slices shown, 15 images]
[im 34/605  mediastinal]
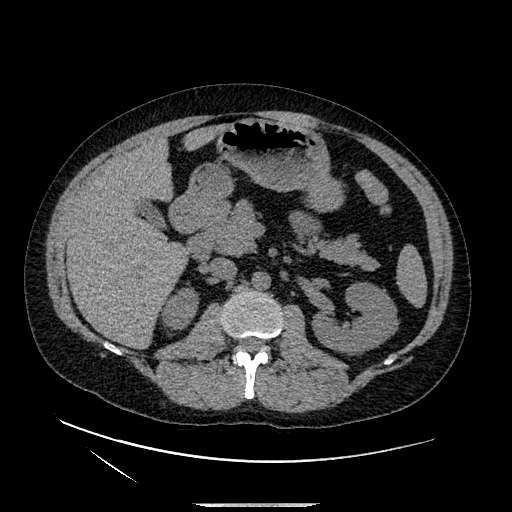
[im 34/605  lung]
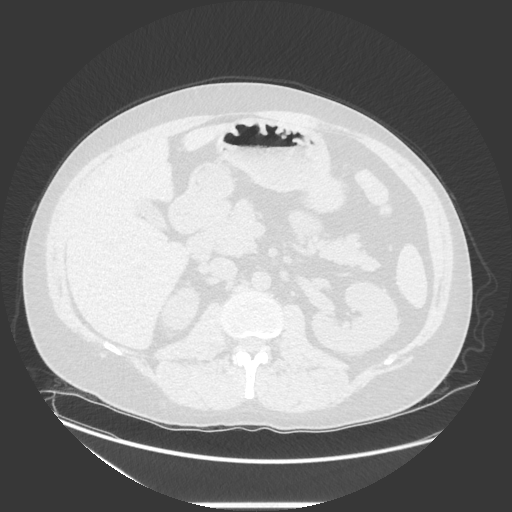
[im 101/605  lung]
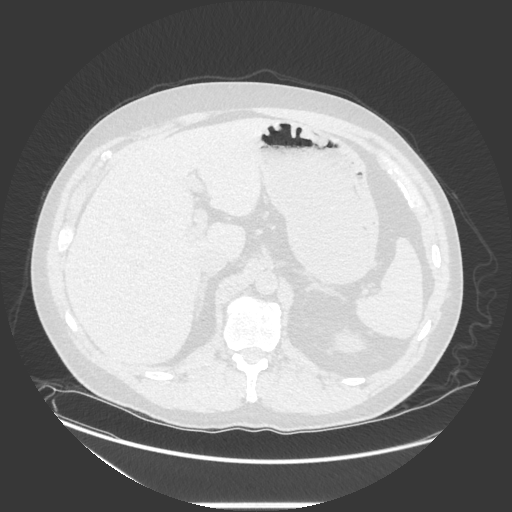
[im 135/605  lung]
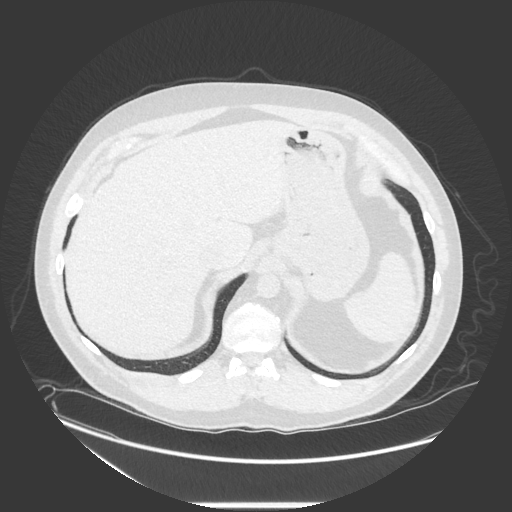
[im 168/605  lung]
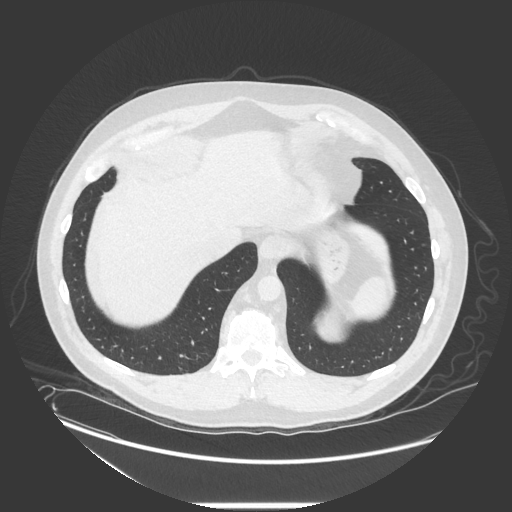
[im 235/605  mediastinal]
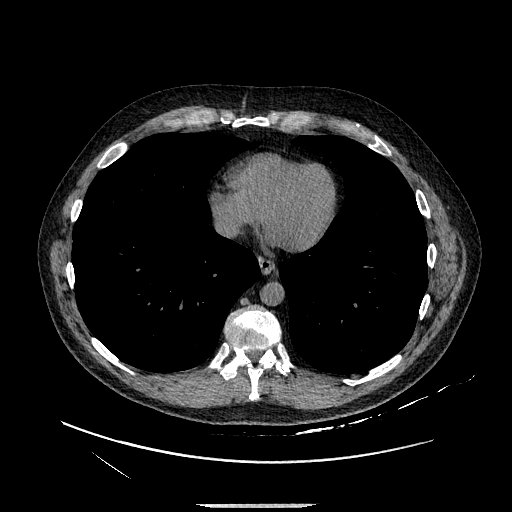
[im 235/605  lung]
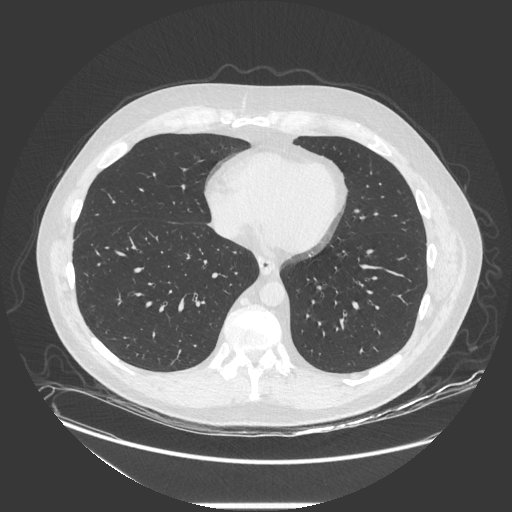
[im 269/605  lung]
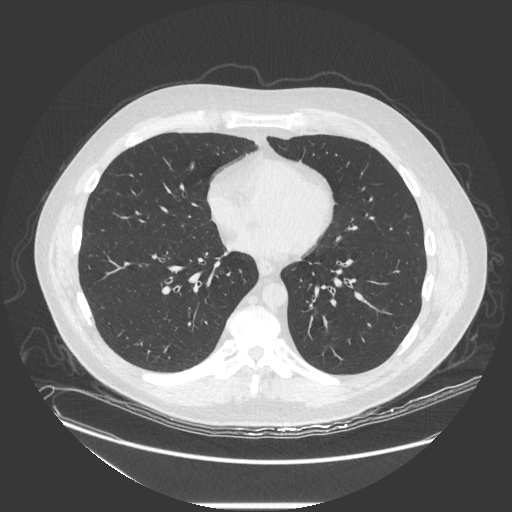
[im 336/605  lung]
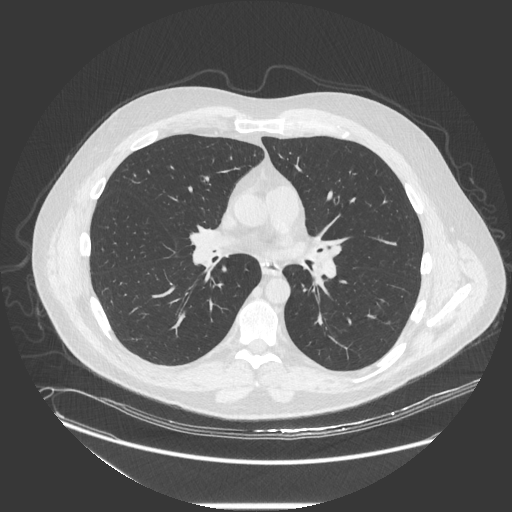
[im 370/605  lung]
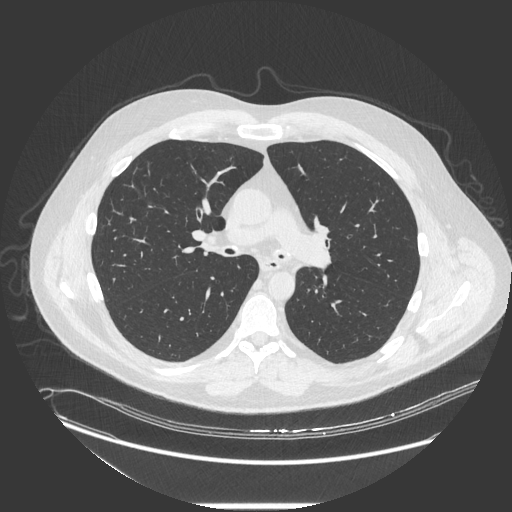
[im 437/605  mediastinal]
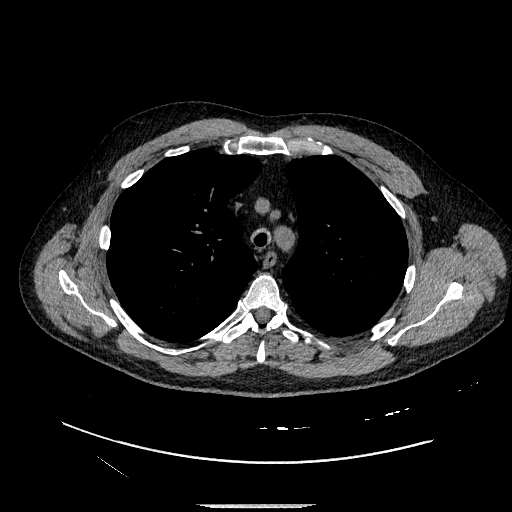
[im 437/605  lung]
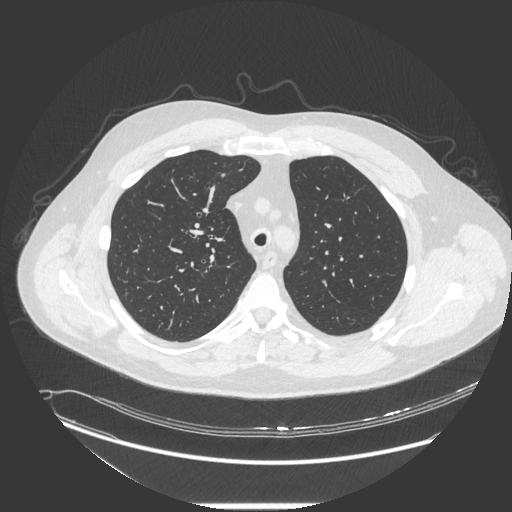
[im 470/605  lung]
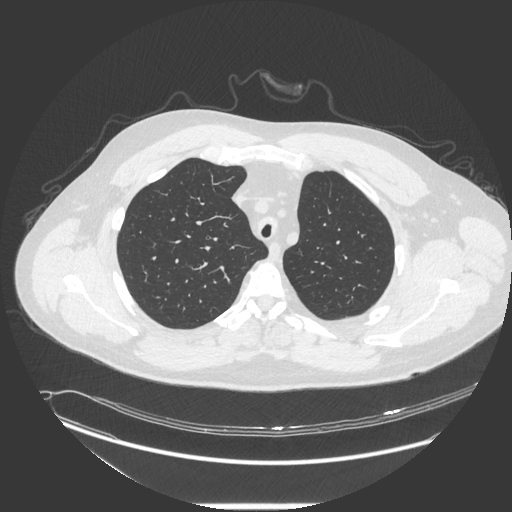
[im 504/605  lung]
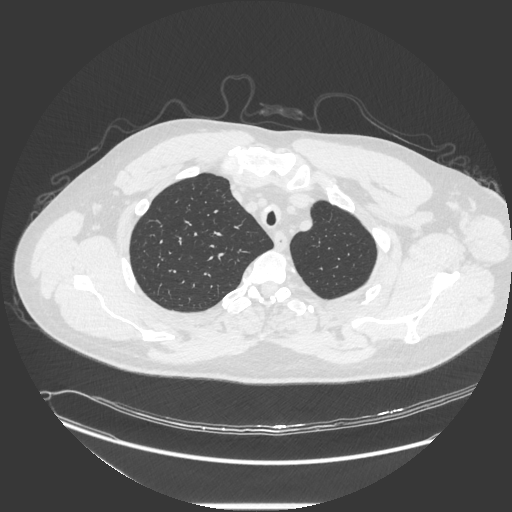
[im 571/605  lung]
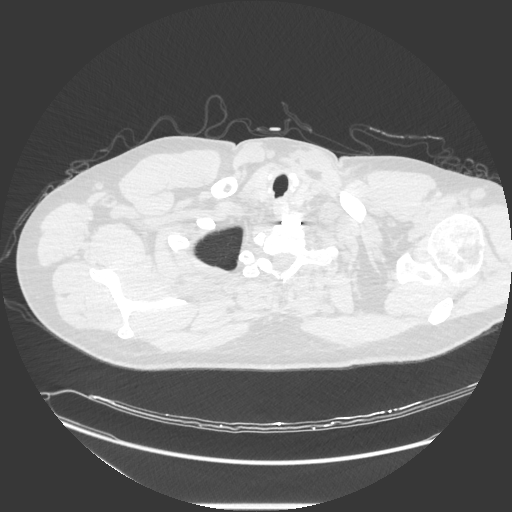

[Series 601: coronal · coronal · 0.88mm/px · 3 of 196 slices shown]
[im 40/196  lung]
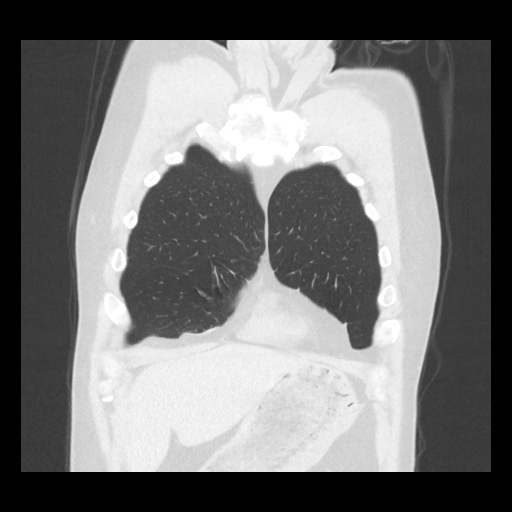
[im 79/196  lung]
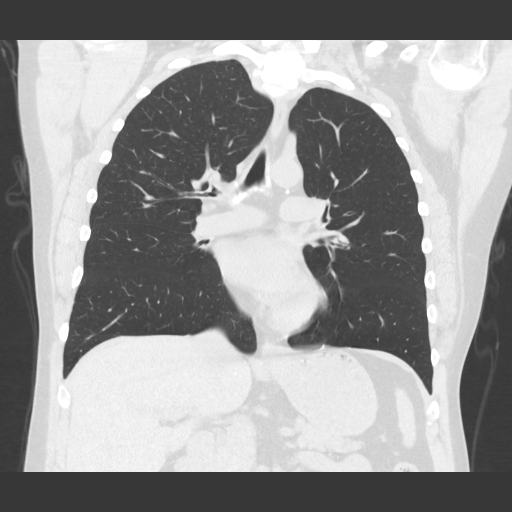
[im 118/196  lung]
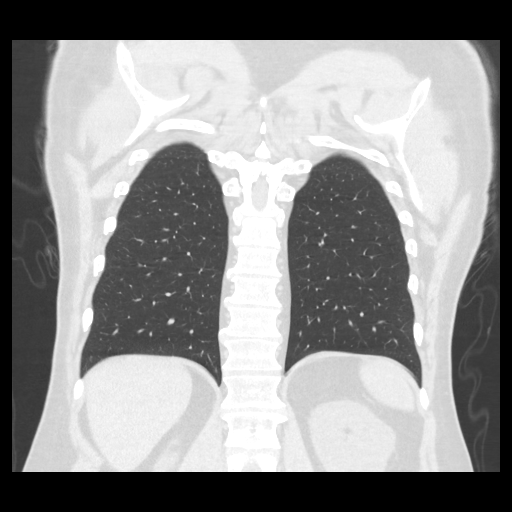

[15 of 36 positions shown; findings below may reference images not displayed]

FINDINGS: Lines and Tubes: None.
Thyroid: Unremarkable.
Lymph Nodes: No suspicious lymphadenopathy.
Heart: Normal heart size. No pericardial effusion. No significant coronary calcifications.
Pulmonary Arteries: Unremarkable.
Aorta: Normal caliber.
Trachea and Main Bronchi: Stable thickening of the trachea and main bronchi with associated calcifications.
Pleura: No pleural effusion or pneumothorax.
Lungs: No consolidation or pulmonary edema.
Pulmonary Nodules: No suspicious pulmonary nodules.
Upper Abdomen: No acute upper abdominal findings.
Soft Tissues: Unremarkable.
Bones: ACDF in the lower cervical spine. No suspicious lytic or blastic lesions.
IMPRESSION: 
IMPRESSION: 1. No significant change since 09/28/2022.
[DATE]. Stable thickening of the trachea and main bronchi with calcifications. Differential diagnosis includes relapsing polychondritis, tracheobronchopathia osteochondroplastica, and granulomatous processes.
All CT scans at this facility use iterative reconstruction technique, dose modulation, and/or weight based dosing when appropriate to reduce radiation dose to as low as reasonably achievable.

## 2023-01-02 DEATH — deceased
